# Patient Record
Sex: Female | Born: 1960 | Race: White | Hispanic: No | Marital: Married | Smoking: Never smoker
Health system: Southern US, Community
[De-identification: ages and names within clinical notes are randomized; demographics above are authoritative.]

## PROBLEM LIST (undated history)

## (undated) ENCOUNTER — Inpatient Hospital Stay (HOSPITAL_COMMUNITY): Payer: BC Managed Care – PPO

## (undated) DIAGNOSIS — R51 Headache: Secondary | ICD-10-CM

## (undated) HISTORY — PX: TONSILLECTOMY: SUR1361

## (undated) HISTORY — PX: FOOT SURGERY: SHX648

---

## 1997-09-06 ENCOUNTER — Ambulatory Visit (HOSPITAL_COMMUNITY): Admission: RE | Admit: 1997-09-06 | Discharge: 1997-09-06 | Payer: Self-pay | Admitting: Obstetrics and Gynecology

## 1997-09-17 ENCOUNTER — Other Ambulatory Visit: Admission: RE | Admit: 1997-09-17 | Discharge: 1997-09-17 | Payer: Self-pay | Admitting: Obstetrics and Gynecology

## 1998-03-14 ENCOUNTER — Ambulatory Visit (HOSPITAL_COMMUNITY): Admission: RE | Admit: 1998-03-14 | Discharge: 1998-03-14 | Payer: Self-pay | Admitting: Obstetrics and Gynecology

## 1998-09-12 ENCOUNTER — Other Ambulatory Visit: Admission: RE | Admit: 1998-09-12 | Discharge: 1998-09-12 | Payer: Self-pay | Admitting: Obstetrics and Gynecology

## 1999-05-01 ENCOUNTER — Ambulatory Visit (HOSPITAL_COMMUNITY): Admission: RE | Admit: 1999-05-01 | Discharge: 1999-05-01 | Payer: Self-pay | Admitting: Obstetrics and Gynecology

## 1999-05-01 ENCOUNTER — Encounter: Payer: Self-pay | Admitting: Obstetrics and Gynecology

## 1999-05-11 ENCOUNTER — Encounter: Payer: Self-pay | Admitting: Obstetrics and Gynecology

## 1999-05-11 ENCOUNTER — Ambulatory Visit (HOSPITAL_COMMUNITY): Admission: RE | Admit: 1999-05-11 | Discharge: 1999-05-11 | Payer: Self-pay | Admitting: Obstetrics and Gynecology

## 1999-10-12 ENCOUNTER — Other Ambulatory Visit: Admission: RE | Admit: 1999-10-12 | Discharge: 1999-10-12 | Payer: Self-pay | Admitting: Obstetrics and Gynecology

## 1999-11-01 ENCOUNTER — Encounter: Payer: Self-pay | Admitting: Obstetrics and Gynecology

## 1999-11-01 ENCOUNTER — Encounter: Admission: RE | Admit: 1999-11-01 | Discharge: 1999-11-01 | Payer: Self-pay | Admitting: Obstetrics and Gynecology

## 1999-12-31 ENCOUNTER — Encounter: Payer: Self-pay | Admitting: *Deleted

## 1999-12-31 ENCOUNTER — Emergency Department (HOSPITAL_COMMUNITY): Admission: EM | Admit: 1999-12-31 | Discharge: 1999-12-31 | Payer: Self-pay | Admitting: *Deleted

## 2000-11-05 ENCOUNTER — Encounter: Admission: RE | Admit: 2000-11-05 | Discharge: 2000-11-05 | Payer: Self-pay | Admitting: Obstetrics and Gynecology

## 2000-11-05 ENCOUNTER — Encounter: Payer: Self-pay | Admitting: Obstetrics and Gynecology

## 2001-02-12 ENCOUNTER — Encounter: Admission: RE | Admit: 2001-02-12 | Discharge: 2001-02-17 | Payer: Self-pay | Admitting: Internal Medicine

## 2001-12-23 ENCOUNTER — Encounter: Payer: Self-pay | Admitting: Obstetrics and Gynecology

## 2001-12-23 ENCOUNTER — Encounter: Admission: RE | Admit: 2001-12-23 | Discharge: 2001-12-23 | Payer: Self-pay | Admitting: Obstetrics and Gynecology

## 2003-01-07 ENCOUNTER — Encounter: Admission: RE | Admit: 2003-01-07 | Discharge: 2003-01-07 | Payer: Self-pay | Admitting: Obstetrics and Gynecology

## 2003-01-07 ENCOUNTER — Encounter: Payer: Self-pay | Admitting: Obstetrics and Gynecology

## 2003-07-22 ENCOUNTER — Other Ambulatory Visit: Admission: RE | Admit: 2003-07-22 | Discharge: 2003-07-22 | Payer: Self-pay | Admitting: Obstetrics and Gynecology

## 2004-01-20 ENCOUNTER — Encounter: Admission: RE | Admit: 2004-01-20 | Discharge: 2004-01-20 | Payer: Self-pay | Admitting: Obstetrics and Gynecology

## 2004-08-17 ENCOUNTER — Other Ambulatory Visit: Admission: RE | Admit: 2004-08-17 | Discharge: 2004-08-17 | Payer: Self-pay | Admitting: Obstetrics and Gynecology

## 2005-03-15 ENCOUNTER — Encounter: Admission: RE | Admit: 2005-03-15 | Discharge: 2005-03-15 | Payer: Self-pay | Admitting: Obstetrics and Gynecology

## 2005-10-24 ENCOUNTER — Encounter: Payer: Self-pay | Admitting: Obstetrics and Gynecology

## 2006-10-23 ENCOUNTER — Encounter: Admission: RE | Admit: 2006-10-23 | Discharge: 2006-10-23 | Payer: Self-pay | Admitting: Obstetrics and Gynecology

## 2008-01-02 ENCOUNTER — Encounter: Admission: RE | Admit: 2008-01-02 | Discharge: 2008-01-02 | Payer: Self-pay | Admitting: Obstetrics and Gynecology

## 2008-04-09 IMAGING — MG MM SCREEN MAMMOGRAM BILATERAL
5 series · 5 of 5 positions shown · non-contrast
Comparison: none

DG SCREEN MAMMOGRAM BILATERAL
Bilateral CC and MLO view(s) were taken.
Prior study comparison: January 20, 2004, bilateral screening mammogram.

DIGITAL SCREENING MAMMOGRAM WITH CAD:
There are scattered fibroglandular densities.  There is no dominant mass, architectural distortion 
or calcification to suggest malignancy.

[R CC]
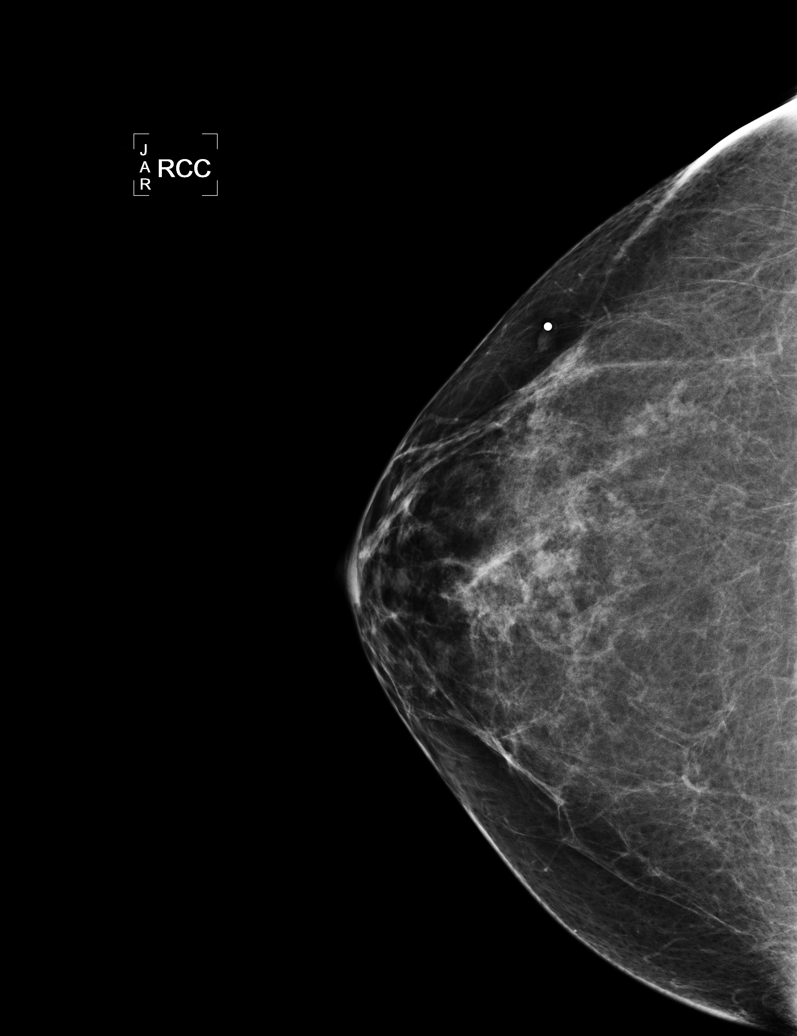

[L CC (1 of 2)]
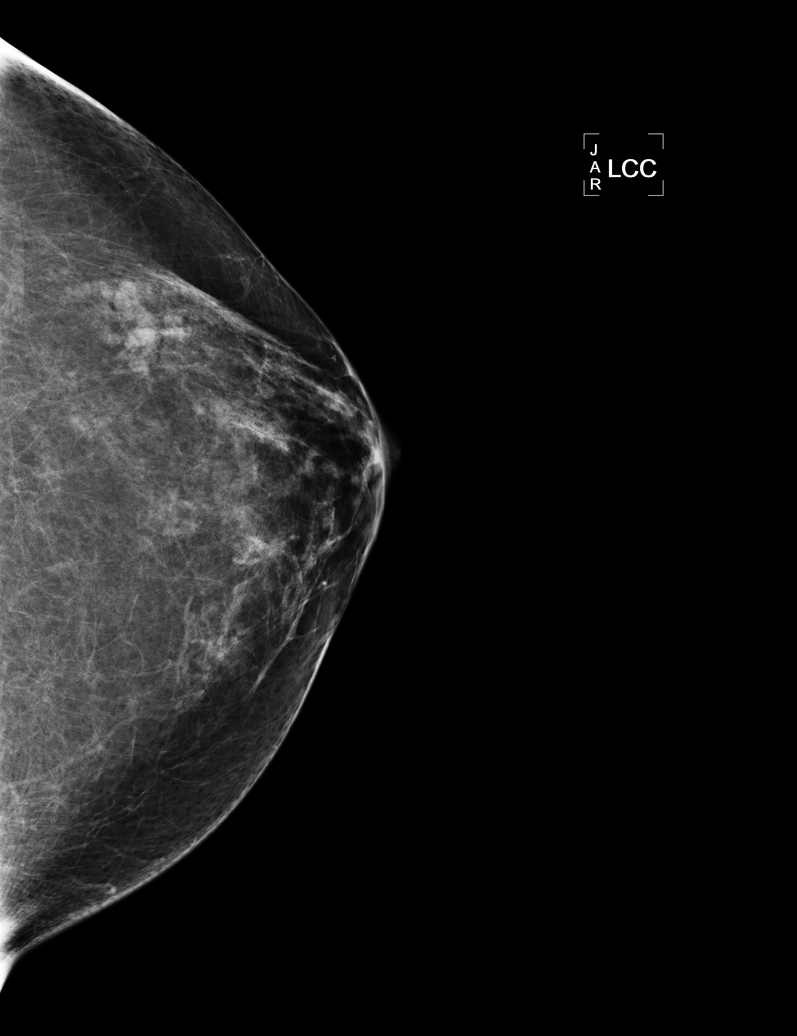

[L MLO]
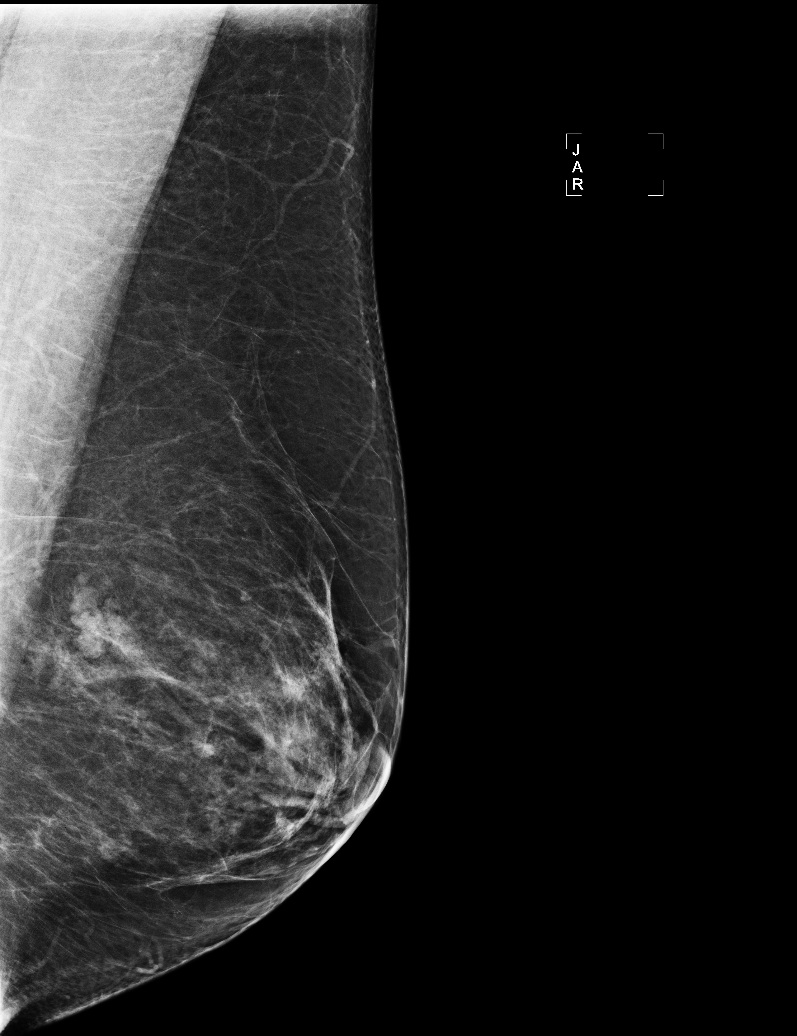

[R MLO]
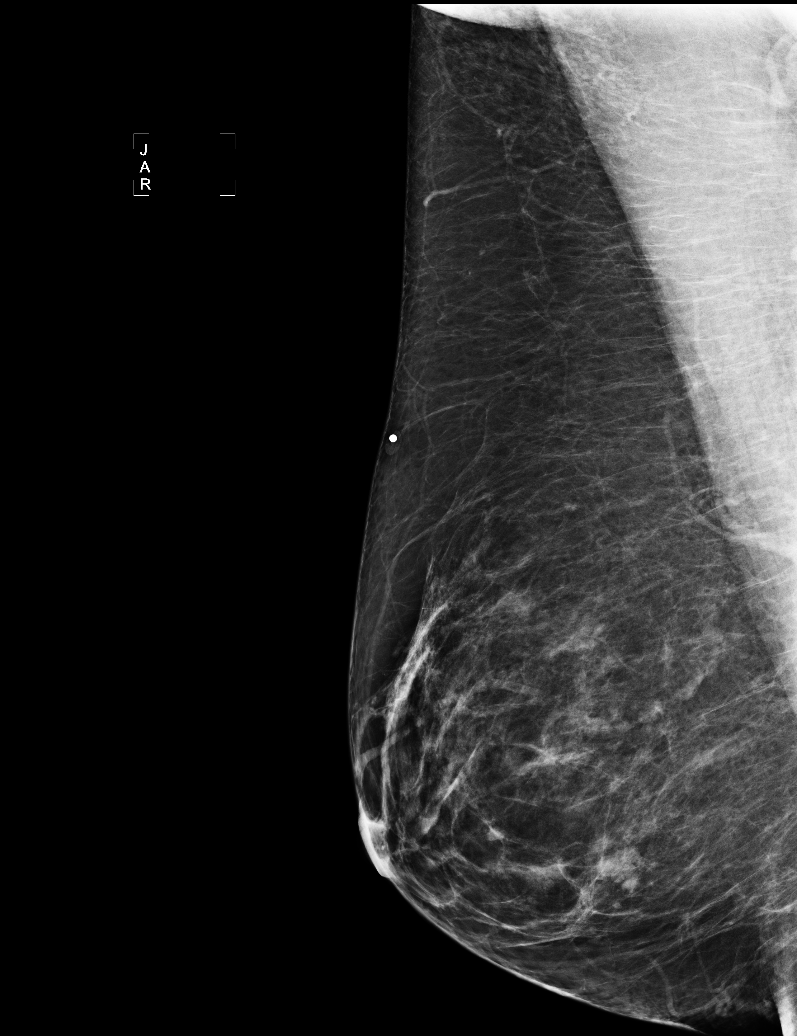

[L CC (2 of 2)]
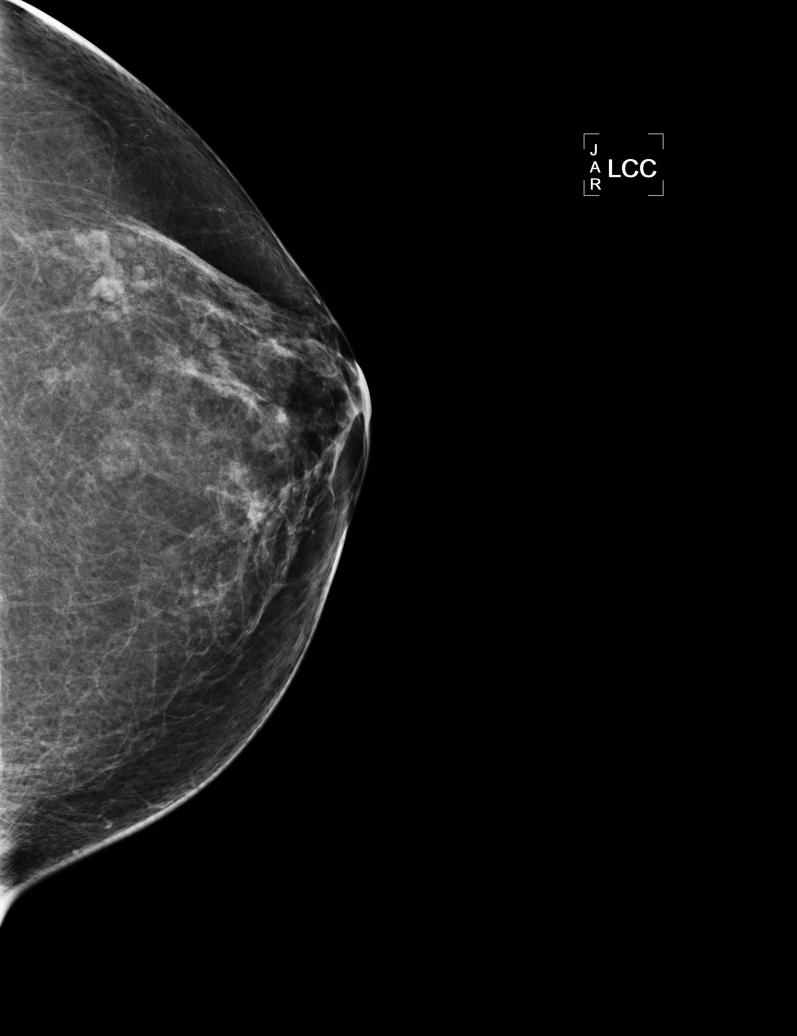

[5 of 5 positions shown; findings below may reference images not displayed]

IMPRESSION: No mammographic evidence of malignancy.  Suggest yearly screening mammography.

ASSESSMENT: Negative - BI-RADS 1

Screening mammogram in 1 year.
ANALYZED BY COMPUTER AIDED DETECTION. , THIS PROCEDURE WAS A DIGITAL MAMMOGRAM.

## 2009-01-03 ENCOUNTER — Encounter: Admission: RE | Admit: 2009-01-03 | Discharge: 2009-01-03 | Payer: Self-pay | Admitting: Obstetrics and Gynecology

## 2009-01-05 ENCOUNTER — Encounter: Admission: RE | Admit: 2009-01-05 | Discharge: 2009-01-05 | Payer: Self-pay | Admitting: Obstetrics and Gynecology

## 2010-01-10 ENCOUNTER — Encounter: Admission: RE | Admit: 2010-01-10 | Discharge: 2010-01-10 | Payer: Self-pay | Admitting: Obstetrics and Gynecology

## 2011-01-09 ENCOUNTER — Encounter (HOSPITAL_COMMUNITY)
Admission: RE | Admit: 2011-01-09 | Discharge: 2011-01-09 | Disposition: A | Discharge status: Home / Self Care | Payer: BC Managed Care – PPO | Source: Ambulatory Visit | Attending: Obstetrics and Gynecology | Admitting: Obstetrics and Gynecology

## 2011-01-09 ENCOUNTER — Encounter (HOSPITAL_COMMUNITY): Payer: Self-pay

## 2011-01-09 HISTORY — DX: Headache: R51

## 2011-01-09 LAB — CBC
HCT: 40.1 % (ref 36.0–46.0)
MCHC: 33.4 g/dL (ref 30.0–36.0)
MCV: 90.9 fL (ref 78.0–100.0)
RBC: 4.41 MIL/uL (ref 3.87–5.11)
RDW: 12.8 % (ref 11.5–15.5)

## 2011-01-09 MED ORDER — DEXTROSE 5 % IV SOLN
2.0000 g | INTRAVENOUS | Status: AC
  Administered 2011-01-10: 2 g via INTRAVENOUS
  Filled 2011-01-09 (×2): qty 2

## 2011-01-09 NOTE — Patient Instructions (Signed)
20 Tanya Lloyd  01/09/2011   Your procedure is scheduled on:  01/10/11  Report to [facility] at 1045 AM.  Call this number if you have problems the morning of surgery: [phone]   Remember:   Do not eat food:After Midnight.  Do not drink clear liquids: 4 Hours before arrival.  Take these medicines the morning of surgery with A SIP OF WATER: none   Do not wear jewelry, make-up or nail polish.  Do not bring valuables to the hospital.  Contacts, dentures or bridgework may not be worn into surgery.  Leave suitcase in the car. After surgery it may be brought to your room.  For patients admitted to the hospital, checkout time is 11:00 AM the day of discharge.   Patients discharged the day of surgery will not be allowed to drive home.  Name and phone number of your driver: Trey Paula  Special Instructions:  Please read over the following fact sheets that you were given:

## 2011-01-10 ENCOUNTER — Ambulatory Visit (HOSPITAL_COMMUNITY): Payer: BC Managed Care – PPO | Admitting: Certified Registered"

## 2011-01-10 ENCOUNTER — Encounter (HOSPITAL_COMMUNITY): Payer: Self-pay | Admitting: Certified Registered"

## 2011-01-10 ENCOUNTER — Encounter (HOSPITAL_COMMUNITY)
Admission: RE | Disposition: A | Discharge status: Home / Self Care | Payer: Self-pay | Source: Ambulatory Visit | Attending: Obstetrics and Gynecology

## 2011-01-10 ENCOUNTER — Other Ambulatory Visit: Payer: Self-pay | Admitting: Obstetrics and Gynecology

## 2011-01-10 ENCOUNTER — Inpatient Hospital Stay (HOSPITAL_COMMUNITY)
Admission: RE | Admit: 2011-01-10 | Discharge: 2011-01-12 | DRG: 359 | Disposition: A | Discharge status: Home / Self Care | Payer: BC Managed Care – PPO | Source: Ambulatory Visit | Attending: Obstetrics and Gynecology | Admitting: Obstetrics and Gynecology

## 2011-01-10 DIAGNOSIS — N393 Stress incontinence (female) (male): Secondary | ICD-10-CM | POA: Diagnosis present

## 2011-01-10 DIAGNOSIS — I341 Nonrheumatic mitral (valve) prolapse: Secondary | ICD-10-CM | POA: Insufficient documentation

## 2011-01-10 DIAGNOSIS — N814 Uterovaginal prolapse, unspecified: Secondary | ICD-10-CM

## 2011-01-10 DIAGNOSIS — Z01818 Encounter for other preprocedural examination: Secondary | ICD-10-CM

## 2011-01-10 DIAGNOSIS — N841 Polyp of cervix uteri: Secondary | ICD-10-CM | POA: Diagnosis present

## 2011-01-10 DIAGNOSIS — N8000 Endometriosis of the uterus, unspecified: Secondary | ICD-10-CM | POA: Diagnosis present

## 2011-01-10 DIAGNOSIS — Z01812 Encounter for preprocedural laboratory examination: Secondary | ICD-10-CM

## 2011-01-10 DIAGNOSIS — N812 Incomplete uterovaginal prolapse: Principal | ICD-10-CM | POA: Diagnosis present

## 2011-01-10 DIAGNOSIS — D259 Leiomyoma of uterus, unspecified: Secondary | ICD-10-CM | POA: Diagnosis present

## 2011-01-10 HISTORY — PX: BLADDER SUSPENSION: SHX72

## 2011-01-10 HISTORY — PX: ANTERIOR AND POSTERIOR REPAIR: SHX5121

## 2011-01-10 HISTORY — PX: VAGINAL HYSTERECTOMY: SHX2639

## 2011-01-10 LAB — PREGNANCY, URINE: Preg Test, Ur: NEGATIVE

## 2011-01-10 SURGERY — HYSTERECTOMY, VAGINAL
Anesthesia: General | Site: Vagina | Wound class: Clean Contaminated

## 2011-01-10 MED ORDER — PROPOFOL 10 MG/ML IV EMUL
INTRAVENOUS | Status: AC
  Filled 2011-01-10: qty 20

## 2011-01-10 MED ORDER — MIDAZOLAM HCL 5 MG/5ML IJ SOLN
INTRAMUSCULAR | Status: DC | PRN
  Administered 2011-01-10: 2 mg via INTRAVENOUS

## 2011-01-10 MED ORDER — INDIGOTINDISULFONATE SODIUM 8 MG/ML IJ SOLN
INTRAMUSCULAR | Status: DC | PRN
  Administered 2011-01-10: 5 mL via INTRAVENOUS

## 2011-01-10 MED ORDER — LIDOCAINE HCL (CARDIAC) 20 MG/ML IV SOLN
INTRAVENOUS | Status: AC
  Filled 2011-01-10: qty 5

## 2011-01-10 MED ORDER — HYDROMORPHONE 0.3 MG/ML IV SOLN
INTRAVENOUS | Status: AC
  Filled 2011-01-10: qty 25

## 2011-01-10 MED ORDER — HYDROMORPHONE 0.3 MG/ML IV SOLN
INTRAVENOUS | Status: DC
  Administered 2011-01-10: 19:00:00 via INTRAVENOUS
  Administered 2011-01-10: 0.9 mg via INTRAVENOUS
  Administered 2011-01-11 (×2): 0.3 mg via INTRAVENOUS

## 2011-01-10 MED ORDER — EPHEDRINE 5 MG/ML INJ
INTRAVENOUS | Status: AC
  Filled 2011-01-10: qty 10

## 2011-01-10 MED ORDER — KETOROLAC TROMETHAMINE 30 MG/ML IJ SOLN
30.0000 mg | Freq: Four times a day (QID) | INTRAMUSCULAR | Status: AC
  Administered 2011-01-10 – 2011-01-11 (×2): 30 mg via INTRAVENOUS
  Filled 2011-01-10 (×2): qty 1

## 2011-01-10 MED ORDER — ALUM & MAG HYDROXIDE-SIMETH 200-200-20 MG/5ML PO SUSP: 30.0000 mL | ORAL | Status: DC | PRN

## 2011-01-10 MED ORDER — MORPHINE SULFATE 10 MG/ML IJ SOLN
INTRAMUSCULAR | Status: AC
  Filled 2011-01-10: qty 1

## 2011-01-10 MED ORDER — MORPHINE SULFATE 10 MG/ML IJ SOLN
INTRAMUSCULAR | Status: DC | PRN
  Administered 2011-01-10 (×5): 2 mg via INTRAVENOUS

## 2011-01-10 MED ORDER — DEXAMETHASONE SODIUM PHOSPHATE 10 MG/ML IJ SOLN
INTRAMUSCULAR | Status: DC | PRN
  Administered 2011-01-10: 4 mg via INTRAVENOUS

## 2011-01-10 MED ORDER — ONDANSETRON HCL 4 MG PO TABS
4.0000 mg | ORAL_TABLET | Freq: Four times a day (QID) | ORAL | Status: DC | PRN
  Administered 2011-01-11 – 2011-01-12 (×2): 4 mg via ORAL
  Filled 2011-01-10 (×2): qty 1

## 2011-01-10 MED ORDER — DIPHENHYDRAMINE HCL 12.5 MG/5ML PO ELIX: 12.5000 mg | ORAL_SOLUTION | Freq: Four times a day (QID) | ORAL | Status: DC | PRN

## 2011-01-10 MED ORDER — LIDOCAINE HCL (PF) 2 % IJ SOLN
INTRAMUSCULAR | Status: DC | PRN
  Administered 2011-01-10: 100 mg

## 2011-01-10 MED ORDER — FENTANYL CITRATE 0.05 MG/ML IJ SOLN
25.0000 ug | INTRAMUSCULAR | Status: DC | PRN
  Administered 2011-01-10: 25 ug via INTRAVENOUS
  Administered 2011-01-10: 50 ug via INTRAVENOUS
  Administered 2011-01-10: 25 ug via INTRAVENOUS

## 2011-01-10 MED ORDER — ONDANSETRON HCL 4 MG/2ML IJ SOLN
4.0000 mg | Freq: Four times a day (QID) | INTRAMUSCULAR | Status: DC | PRN
  Administered 2011-01-10: 4 mg via INTRAVENOUS

## 2011-01-10 MED ORDER — PROPOFOL 10 MG/ML IV EMUL
INTRAVENOUS | Status: DC | PRN
  Administered 2011-01-10 (×2): 10 mg via INTRAVENOUS
  Administered 2011-01-10: 200 mg via INTRAVENOUS

## 2011-01-10 MED ORDER — ROCURONIUM BROMIDE 50 MG/5ML IV SOLN
INTRAVENOUS | Status: AC
  Filled 2011-01-10: qty 1

## 2011-01-10 MED ORDER — KETOROLAC TROMETHAMINE 30 MG/ML IJ SOLN
INTRAMUSCULAR | Status: DC | PRN
  Administered 2011-01-10: 30 mg via INTRAVENOUS

## 2011-01-10 MED ORDER — SUMATRIPTAN SUCCINATE 50 MG PO TABS
50.0000 mg | ORAL_TABLET | ORAL | Status: DC | PRN
  Filled 2011-01-10: qty 1

## 2011-01-10 MED ORDER — STERILE WATER FOR IRRIGATION IR SOLN
Status: DC | PRN
  Administered 2011-01-10: 1000 mL

## 2011-01-10 MED ORDER — ONDANSETRON HCL 4 MG/2ML IJ SOLN
INTRAMUSCULAR | Status: AC
  Filled 2011-01-10: qty 2

## 2011-01-10 MED ORDER — TEMAZEPAM 15 MG PO CAPS: 15.0000 mg | ORAL_CAPSULE | Freq: Every evening | ORAL | Status: DC | PRN

## 2011-01-10 MED ORDER — ONDANSETRON HCL 4 MG/2ML IJ SOLN
INTRAMUSCULAR | Status: DC | PRN
  Administered 2011-01-10: 4 mg via INTRAVENOUS

## 2011-01-10 MED ORDER — NEOSTIGMINE METHYLSULFATE 1 MG/ML IJ SOLN
INTRAMUSCULAR | Status: AC
  Filled 2011-01-10: qty 10

## 2011-01-10 MED ORDER — FENTANYL CITRATE 0.05 MG/ML IJ SOLN
INTRAMUSCULAR | Status: DC | PRN
  Administered 2011-01-10: 50 ug via INTRAVENOUS
  Administered 2011-01-10: 25 ug via INTRAVENOUS
  Administered 2011-01-10: 100 ug via INTRAVENOUS
  Administered 2011-01-10: 25 ug via INTRAVENOUS
  Administered 2011-01-10: 50 ug via INTRAVENOUS

## 2011-01-10 MED ORDER — SIMETHICONE 80 MG PO CHEW: 80.0000 mg | CHEWABLE_TABLET | Freq: Four times a day (QID) | ORAL | Status: DC | PRN

## 2011-01-10 MED ORDER — GLYCOPYRROLATE 0.2 MG/ML IJ SOLN
INTRAMUSCULAR | Status: AC
  Filled 2011-01-10: qty 1

## 2011-01-10 MED ORDER — MEPERIDINE HCL 25 MG/ML IJ SOLN: 6.2500 mg | INTRAMUSCULAR | Status: DC | PRN

## 2011-01-10 MED ORDER — SODIUM CHLORIDE 0.9 % IJ SOLN: 9.0000 mL | INTRAMUSCULAR | Status: DC | PRN

## 2011-01-10 MED ORDER — OXYCODONE-ACETAMINOPHEN 5-325 MG PO TABS
1.0000 | ORAL_TABLET | ORAL | Status: DC | PRN
  Administered 2011-01-11: 1 via ORAL
  Filled 2011-01-10: qty 2

## 2011-01-10 MED ORDER — DOCUSATE SODIUM 100 MG PO CAPS
100.0000 mg | ORAL_CAPSULE | Freq: Every day | ORAL | Status: DC
  Administered 2011-01-11 – 2011-01-12 (×2): 100 mg via ORAL
  Filled 2011-01-10 (×2): qty 1

## 2011-01-10 MED ORDER — LIDOCAINE-EPINEPHRINE (PF) 1 %-1:200000 IJ SOLN
INTRAMUSCULAR | Status: DC | PRN
  Administered 2011-01-10: 55 mL

## 2011-01-10 MED ORDER — LACTATED RINGERS IV SOLN
INTRAVENOUS | Status: DC
  Administered 2011-01-10 – 2011-01-11 (×2): via INTRAVENOUS

## 2011-01-10 MED ORDER — IBUPROFEN 600 MG PO TABS
600.0000 mg | ORAL_TABLET | Freq: Four times a day (QID) | ORAL | Status: DC | PRN
  Administered 2011-01-11 – 2011-01-12 (×3): 600 mg via ORAL
  Filled 2011-01-10 (×3): qty 1

## 2011-01-10 MED ORDER — MIDAZOLAM HCL 2 MG/2ML IJ SOLN
INTRAMUSCULAR | Status: AC
  Filled 2011-01-10: qty 2

## 2011-01-10 MED ORDER — KETOROLAC TROMETHAMINE 30 MG/ML IJ SOLN: 30.0000 mg | Freq: Four times a day (QID) | INTRAMUSCULAR | Status: DC

## 2011-01-10 MED ORDER — FENTANYL CITRATE 0.05 MG/ML IJ SOLN
INTRAMUSCULAR | Status: AC
  Filled 2011-01-10: qty 5

## 2011-01-10 MED ORDER — ONDANSETRON HCL 4 MG/2ML IJ SOLN
4.0000 mg | Freq: Four times a day (QID) | INTRAMUSCULAR | Status: DC | PRN
  Administered 2011-01-11: 4 mg via INTRAVENOUS
  Filled 2011-01-10 (×2): qty 2

## 2011-01-10 MED ORDER — EPHEDRINE SULFATE 50 MG/ML IJ SOLN
INTRAMUSCULAR | Status: DC | PRN
  Administered 2011-01-10: 10 mg via INTRAVENOUS

## 2011-01-10 MED ORDER — FENTANYL CITRATE 0.05 MG/ML IJ SOLN
INTRAMUSCULAR | Status: AC
  Administered 2011-01-10: 25 ug via INTRAVENOUS
  Filled 2011-01-10: qty 2

## 2011-01-10 MED ORDER — DULOXETINE HCL 30 MG PO CPEP
30.0000 mg | ORAL_CAPSULE | Freq: Every day | ORAL | Status: DC
  Administered 2011-01-11 – 2011-01-12 (×2): 30 mg via ORAL
  Filled 2011-01-10 (×4): qty 1

## 2011-01-10 MED ORDER — GLYCOPYRROLATE 0.2 MG/ML IJ SOLN
INTRAMUSCULAR | Status: DC | PRN
  Administered 2011-01-10: .15 mg via INTRAVENOUS

## 2011-01-10 MED ORDER — DIPHENHYDRAMINE HCL 50 MG/ML IJ SOLN: 12.5000 mg | Freq: Four times a day (QID) | INTRAMUSCULAR | Status: DC | PRN

## 2011-01-10 MED ORDER — MENTHOL 3 MG MT LOZG: 1.0000 | LOZENGE | OROMUCOSAL | Status: DC | PRN

## 2011-01-10 MED ORDER — ESTRADIOL 0.1 MG/GM VA CREA
TOPICAL_CREAM | VAGINAL | Status: DC | PRN
  Administered 2011-01-10: 1 via VAGINAL

## 2011-01-10 MED ORDER — NALOXONE HCL 0.4 MG/ML IJ SOLN: 0.4000 mg | INTRAMUSCULAR | Status: DC | PRN

## 2011-01-10 MED ORDER — LACTATED RINGERS IV SOLN
INTRAVENOUS | Status: DC
  Administered 2011-01-10 (×2): via INTRAVENOUS

## 2011-01-10 MED ORDER — BISACODYL 10 MG RE SUPP: 10.0000 mg | Freq: Every day | RECTAL | Status: DC | PRN

## 2011-01-10 MED ORDER — SENNOSIDES-DOCUSATE SODIUM 8.6-50 MG PO TABS: 2.0000 | ORAL_TABLET | Freq: Every day | ORAL | Status: DC | PRN

## 2011-01-10 MED ORDER — DEXAMETHASONE SODIUM PHOSPHATE 10 MG/ML IJ SOLN
INTRAMUSCULAR | Status: AC
  Filled 2011-01-10: qty 1

## 2011-01-10 SURGICAL SUPPLY — 59 items
BAG URINE DRAINAGE (UROLOGICAL SUPPLIES) ×1 IMPLANT
BLADE SURG 11 STRL SS (BLADE) ×2 IMPLANT
BLADE SURG 15 STRL LF C SS BP (BLADE) ×1 IMPLANT
BLADE SURG 15 STRL SS (BLADE) ×2
CANISTER SUCTION 2500CC (MISCELLANEOUS) ×3 IMPLANT
CATH FOLEY 2WAY SLVR  5CC 18FR (CATHETERS) ×1
CATH FOLEY 2WAY SLVR 5CC 18FR (CATHETERS) ×2 IMPLANT
CLOTH BEACON ORANGE TIMEOUT ST (SAFETY) ×3 IMPLANT
CONT PATH 16OZ SNAP LID 3702 (MISCELLANEOUS) IMPLANT
DECANTER SPIKE VIAL GLASS SM (MISCELLANEOUS) IMPLANT
DERMABOND ADVANCED (GAUZE/BANDAGES/DRESSINGS) ×3 IMPLANT
DRAPE CAMERA CLOSED 9X96 (DRAPES) IMPLANT
DRAPE HYSTEROSCOPY (DRAPE) ×2 IMPLANT
DRAPE STERI URO 9X17 APER PCH (DRAPES) ×2 IMPLANT
DRAPE UTILITY XL STRL (DRAPES) ×2 IMPLANT
DRSG COVADERM PLUS 2X2 (GAUZE/BANDAGES/DRESSINGS) IMPLANT
GAUZE PACKING 2X5 YD STERILE (GAUZE/BANDAGES/DRESSINGS) IMPLANT
GAUZE SPONGE 4X4 16PLY XRAY LF (GAUZE/BANDAGES/DRESSINGS) ×3 IMPLANT
GLOVE BIO SURGEON STRL SZ7.5 (GLOVE) ×4 IMPLANT
GLOVE BIOGEL PI IND STRL 7.0 (GLOVE) ×2 IMPLANT
GLOVE BIOGEL PI IND STRL 7.5 (GLOVE) ×1 IMPLANT
GLOVE BIOGEL PI INDICATOR 7.0 (GLOVE) ×2
GLOVE BIOGEL PI INDICATOR 7.5 (GLOVE) ×1
GLOVE ECLIPSE 6.0 STRL STRAW (GLOVE) ×1 IMPLANT
GLOVE ECLIPSE 6.5 STRL STRAW (GLOVE) ×2 IMPLANT
GLOVE INDICATOR 6.5 STRL GRN (GLOVE) ×1 IMPLANT
GOWN BRE IMP SLV AUR LG STRL (GOWN DISPOSABLE) ×11 IMPLANT
NDL SPNL 20GX3.5 QUINCKE YW (NEEDLE) ×1 IMPLANT
NDL SPNL 22GX3.5 QUINCKE BK (NEEDLE) IMPLANT
NEEDLE HYPO 22GX1.5 SAFETY (NEEDLE) ×2 IMPLANT
NEEDLE SPNL 20GX3.5 QUINCKE YW (NEEDLE) ×2 IMPLANT
NEEDLE SPNL 22GX3.5 QUINCKE BK (NEEDLE) IMPLANT
NS IRRIG 1000ML POUR BTL (IV SOLUTION) ×3 IMPLANT
PACK VAGINAL WOMENS (CUSTOM PROCEDURE TRAY) ×3 IMPLANT
SET CYSTO W/LG BORE CLAMP LF (SET/KITS/TRAYS/PACK) ×2 IMPLANT
SLING TRANS VAGINAL TAPE (Sling) ×1 IMPLANT
SLING UTERINE/ABD GYNECARE TVT (Sling) ×1 IMPLANT
SUT MNCRL AB 3-0 PS2 27 (SUTURE) IMPLANT
SUT MNCRL AB 4-0 PS2 18 (SUTURE) IMPLANT
SUT NOVA NAB GS-22 2 0 T19 (SUTURE) IMPLANT
SUT SILK 2 0 FSL 18 (SUTURE) IMPLANT
SUT VIC AB 0 CT1 18XCR BRD8 (SUTURE) ×3 IMPLANT
SUT VIC AB 0 CT1 27 (SUTURE) ×4
SUT VIC AB 0 CT1 27XBRD ANBCTR (SUTURE) ×3 IMPLANT
SUT VIC AB 0 CT1 8-18 (SUTURE) ×4
SUT VIC AB 2-0 CT1 27 (SUTURE)
SUT VIC AB 2-0 CT1 TAPERPNT 27 (SUTURE) IMPLANT
SUT VIC AB 2-0 CT2 27 (SUTURE) IMPLANT
SUT VIC AB 2-0 SH 27 (SUTURE) ×20
SUT VIC AB 2-0 SH 27XBRD (SUTURE) ×8 IMPLANT
SUT VIC AB 3-0 CT1 27 (SUTURE)
SUT VIC AB 3-0 CT1 TAPERPNT 27 (SUTURE) IMPLANT
SUT VIC AB 3-0 SH 27 (SUTURE)
SUT VIC AB 3-0 SH 27XBRD (SUTURE) IMPLANT
SUT VICRYL 0 TIES 12 18 (SUTURE) ×2 IMPLANT
SYR TB 1ML 25GX5/8 (SYRINGE) ×2 IMPLANT
TOWEL OR 17X24 6PK STRL BLUE (TOWEL DISPOSABLE) ×7 IMPLANT
TRAY FOLEY CATH 14FR (SET/KITS/TRAYS/PACK) ×3 IMPLANT
WATER STERILE IRR 1000ML POUR (IV SOLUTION) ×4 IMPLANT

## 2011-01-10 NOTE — Transfer of Care (Incomplete)
Immediate Anesthesia Transfer of Care Note  Patient: Tanya Lloyd  Procedure(s) Performed:  HYSTERECTOMY VAGINAL; ANTERIOR (CYSTOCELE) AND POSTERIOR REPAIR (RECTOCELE); TRANSVAGINAL TAPE (TVT) PROCEDURE - with Cystoscopy  Patient Location: {PLACES; ANE POST:19477::"PACU"}  Anesthesia Type: {PROCEDURES; ANE POST ANESTHESIA TYPE:19480}  Level of Consciousness: {FINDINGS; ANE POST LEVEL OF CONSCIOUSNESS:19484}  Airway & Oxygen Therapy: {Exam; oxygen device:30095}  Post-op Assessment: {ASSESSMENT;POST-OP ZOXWRU:04540}  Post vital signs: {DESC; ANE POST JWJXBJ:47829}  Complications: {FINDINGS; ANE POST COMPLICATIONS:19485}

## 2011-01-10 NOTE — Transfer of Care (Signed)
Immediate Anesthesia Transfer of Care Note  Patient: Tanya Lloyd  Procedure(s) Performed:  HYSTERECTOMY VAGINAL; ANTERIOR (CYSTOCELE) AND POSTERIOR REPAIR (RECTOCELE); TRANSVAGINAL TAPE (TVT) PROCEDURE - with Cystoscopy  Patient Location: PACU  Anesthesia Type: General  Level of Consciousness: awake, alert  and oriented  Airway & Oxygen Therapy: Patient Spontanous Breathing and Patient connected to nasal cannula oxygen  Post-op Assessment: Report given to PACU RN and Post -op Vital signs reviewed and stable  Post vital signs: Reviewed and stable  Complications: No apparent anesthesia complications

## 2011-01-10 NOTE — Op Note (Addendum)
Preop Diagnosis: SUI  Postop Diagnosis: SUI  Procedure:1.TVT 2. Cystoscopy  Complications:none  Procedure: While the patient was in day surgery prior to starting the procedure, the risk benefits and alternatives were discussed with the patient including but not limited to bleeding, infection, injury, urinary retention and erosion of mesh. The patient was taken to the operating room where Dr. Estanislado Pandy performed her portion of the surgery. I then proceeded to perform a TVT and cystoscopy. The anterior vaginal wall was already incised and this area was incised further rostrally. The underlying tissue was dissected away down to the level of the lower symphysis pubis bilaterally. Attention was then turned to the mons pubis where two 5 mm incisions were made 2 fingerbreadths from the midline. The transabdominal guide was then passed through the mons pubis incision on the patient's right down through the space of Retzius and out through the anterior vaginal wall after deflecting the rigid urethral catheter guide to the ipsilateral side. The same was done on the contralateral side. Cystoscopy was performed and no invadvertant bladder injury was noted. The bladder was drained with a Foley while deflecting the rigid urethral catheter guide to the patient's right and the mesh was attached to the transabdominal guide and elevated up through the space of Retzius and out through the incision on the mons pubis on the ipsilateral side. The same was done on the contralateral side. Cystoscopy was performed again and no inadvertant bladder injury was noted. The 64 French Foley was left in the urethra and a large Tresa Endo was placed between the urethra and the mesh in order to leave the mesh slack beneath the midurethra. The mesh was then cut flush with the skin at the mons pubis incisions bilaterally. Indigo carmine had been administered, cystoscopy was performed again and bilateral ureters were noted to efflux without difficulty.  The bilateral incisions on the mons pubis were then cleaned and Dermabond applied. The anterior vaginal wall incision was repaired with 2-0 vicryl with interrupted stitches.  Vagina was packed with estrogen soaked vaginal packing.  Sponge, lap and needle count was correct.  The patient tolerated the procedure well and was returned to the recovery room in good condition.

## 2011-01-10 NOTE — Anesthesia Procedure Notes (Addendum)
Procedures

## 2011-01-10 NOTE — Anesthesia Postprocedure Evaluation (Signed)
  Anesthesia Post-op Note  Patient: Tanya Lloyd  Procedure(s) Performed:  HYSTERECTOMY VAGINAL; ANTERIOR (CYSTOCELE) AND POSTERIOR REPAIR (RECTOCELE); TRANSVAGINAL TAPE (TVT) PROCEDURE - with Cystoscopy  Patient Location: PACU  Anesthesia Type: General  Level of Consciousness: awake, alert  and oriented  Airway and Oxygen Therapy: Patient Spontanous Breathing  Post-op Pain: 3 /10  Post-op Assessment: Post-op Vital signs reviewed, Patient's Cardiovascular Status Stable, Respiratory Function Stable, Patent Airway, No signs of Nausea or vomiting and Pain level controlled  Post-op Vital Signs: Reviewed and stable  Complications: No apparent anesthesia complications

## 2011-01-10 NOTE — OR Nursing (Signed)
Narda Rutherford, RN also participated in the 1245 Time Out.

## 2011-01-10 NOTE — Anesthesia Preprocedure Evaluation (Addendum)
Anesthesia Evaluation  Name, MR# and DOB Patient awake  General Assessment Comment  Reviewed: Allergy & Precautions, H&P , Patient's Chart, lab work & pertinent test results and reviewed documented beta blocker date and time   Airway Mallampati: II TM Distance: <3 FB Neck ROM: full   Comment: Slightly small chin No anesthesia problems in past Dental No notable dental hx    Pulmonaryneg pulmonary ROS    clear to auscultation  pulmonary exam normal   Cardiovascular regular Normal   Neuro/PsychNegative Neurological ROS Negative Psych ROS  GI/Hepatic/Renal negative GI ROS, negative Liver ROS, and negative Renal ROS (+)       Endo/Other  Negative Endocrine ROS (+)  Morbid obesity Abdominal   Musculoskeletal  Hematology negative hematology ROS (+)   Peds  Reproductive/Obstetrics   Anesthesia Other Findings             Anesthesia Physical Anesthesia Plan  ASA: III  Anesthesia Plan: General   Post-op Pain Management:    Induction: Intravenous  Airway Management Planned: LMA  Additional Equipment:   Intra-op Plan:   Post-operative Plan:   Informed Consent: I have reviewed the patients History and Physical, chart, labs and discussed the procedure including the risks, benefits and alternatives for the proposed anesthesia with the patient or authorized representative who has indicated his/her understanding and acceptance.   Dental Advisory Given  Plan Discussed with: CRNA and Surgeon  Anesthesia Plan Comments: ( 13/40 Discussed  general anesthesia, including possible nausea, instrumentation of airway, sore throat,pulmonary aspiration, etc. I asked if the were any outstanding questions, or  concerns before we proceeded. )      Anesthesia Quick Evaluation

## 2011-01-10 NOTE — H&P (Signed)
History and Physical Interval Note:   01/10/2011   12:42 PM   Tanya Lloyd  has presented today for surgery, with the diagnosis of pelvic prolapse;stress urinary incontinence  The various methods of treatment have been discussed with the patient and family. After consideration of risks, benefits and other options for treatment, the patient has consented to  Procedure(s): HYSTERECTOMY VAGINAL ANTERIOR (CYSTOCELE) AND POSTERIOR REPAIR (RECTOCELE) TRANSVAGINAL TAPE (TVT) PROCEDURE as a surgical intervention .  I have reviewed the patients' chart and labs.  Questions were answered to the patient's satisfaction.    This is an update to [facility] dictation [phone].  Silverio Lay A  MD

## 2011-01-10 NOTE — Brief Op Note (Signed)
  01/10/2011  3:17 PM  PATIENT:  Mena Pauls  50 y.o. female  PRE-OPERATIVE DIAGNOSIS:  pelvic prolapse;stress urinary incontinence, cystocele and rectocele  POST-OPERATIVE DIAGNOSIS:  same  PROCEDURE:  Procedure(s): HYSTERECTOMY VAGINAL ANTERIOR (CYSTOCELE) AND POSTERIOR REPAIR (RECTOCELE) TRANSVAGINAL TAPE (TVT) PROCEDURE  SURGEON:  Surgeon(s): Dois Davenport A Orest Dygert Purcell Nails MD  ASSISTANTS: Osborn Coho MD  ANESTHESIA:   local and general  ESTIMATED BLOOD LOSS: * No blood loss amount entered *   LOCAL MEDICATIONS USED:  OTHER lidocaine 1% with epinephrine 1: 200,000  SPECIMEN:  Uterus  DISPOSITION OF SPECIMEN:  PATHOLOGY  COUNTS:  YES  EBL: 200 mL  DICTATION #: [id number]  PATIENT DISPOSITION:  PACU - hemodynamically stable.    NFAOZH,YQMVHQ A MD 01/10/2011 3:17 PM

## 2011-01-11 LAB — CBC
Hemoglobin: 10.8 g/dL — ABNORMAL LOW (ref 12.0–15.0)
MCH: 30.6 pg (ref 26.0–34.0)
RBC: 3.53 MIL/uL — ABNORMAL LOW (ref 3.87–5.11)
WBC: 14 10*3/uL — ABNORMAL HIGH (ref 4.0–10.5)

## 2011-01-11 MED ORDER — PROMETHAZINE HCL 25 MG/ML IJ SOLN
12.5000 mg | INTRAVENOUS | Status: DC
  Filled 2011-01-11: qty 0.5

## 2011-01-11 MED ORDER — SODIUM CHLORIDE 0.9 % IJ SOLN: 3.0000 mL | Freq: Two times a day (BID) | INTRAMUSCULAR | Status: DC

## 2011-01-11 MED ORDER — SODIUM CHLORIDE 0.9 % IJ SOLN
3.0000 mL | Freq: Once | INTRAMUSCULAR | Status: AC
  Administered 2011-01-11: 3 mL via INTRAVENOUS

## 2011-01-11 MED ORDER — PROMETHAZINE HCL 25 MG/ML IJ SOLN
12.5000 mg | Freq: Four times a day (QID) | INTRAMUSCULAR | Status: DC | PRN
  Administered 2011-01-11: 12.5 mg via INTRAVENOUS
  Filled 2011-01-11: qty 1

## 2011-01-11 MED ORDER — TRAMADOL HCL 50 MG PO TABS
50.0000 mg | ORAL_TABLET | Freq: Four times a day (QID) | ORAL | Status: DC | PRN
  Administered 2011-01-11 – 2011-01-12 (×4): 50 mg via ORAL
  Filled 2011-01-11 (×5): qty 1

## 2011-01-11 NOTE — Progress Notes (Addendum)
  1 Day Post-Op Procedure(s) (LRB): HYSTERECTOMY VAGINAL (N/A) ANTERIOR (CYSTOCELE) AND POSTERIOR REPAIR (RECTOCELE) (N/A) TRANSVAGINAL TAPE (TVT) PROCEDURE (N/A)  Subjective: Patient reports that pain is well managed with iv Toradol only: 3/10 Tolerating clear liquids  diet without difficulty. Struggled with nausea every time used Dilaudid PCA / no vomiting this am.  Ambulating. Foley catheter draining well...has not been removed yet.  Objective: BP 102/69  Pulse 68  Temp(Src) 97.6 F (36.4 C) (Oral)  Resp 16  Ht 5\' 9"  (1.753 m)  SpO2 98% Lungs: clear Heart: normal rate and rhythm Abdomen:soft and appropriately tender Extremities: Homans sign is negative, no sign of DVT Incision: supra-pubic with slight tenderness.  Assessment: s/p Procedure(s): HYSTERECTOMY VAGINAL ANTERIOR (CYSTOCELE) AND POSTERIOR REPAIR (RECTOCELE) TRANSVAGINAL TAPE (TVT) PROCEDURE: progressing well  Plan: Advance diet Encourage ambulation Advance to PO medication Observe voiding success after Foley removal  LOS: 1 day    Shivansh Hardaway A 01/11/2011, 8:23 AM     Results for MACLAINE, AHOLA (MRN [id number]) as of 01/11/2011 08:29  Ref. Range 01/09/2011 16:25 01/11/2011 05:15  WBC Latest Range: 4.0-10.5 K/uL 8.3 14.0 (H)  RBC Latest Range: 3.87-5.11 MIL/uL 4.41 3.53 (L)  HGB Latest Range: 12.0-15.0 g/dL 40.9 81.1 (L)  HCT Latest Range: 36.0-46.0 % 40.1 32.3 (L)  MCV Latest Range: 78.0-100.0 fL 90.9 91.5  MCH Latest Range: 26.0-34.0 pg 30.4 30.6  MCHC Latest Range: 30.0-36.0 g/dL 91.4 78.2  RDW Latest Range: 11.5-15.5 % 12.8 12.8  Platelets Latest Range: 150-400 K/uL 294 243

## 2011-01-11 NOTE — Op Note (Signed)
NAMEELLAN, Tanya              ACCOUNT NO.:  [id number]  MEDICAL RECORD NO.:  [id number]  LOCATION:  9318                          FACILITY:  [facility]  PHYSICIAN:  Crist Fat. Jacen Carlini, M.D. DATE OF BIRTH:  [birth year]  DATE OF PROCEDURE:  01/10/2011 DATE OF DISCHARGE:                              OPERATIVE REPORT   PREOPERATIVE DIAGNOSES:  Uterine prolapse with cystocele and rectocele.  POSTOPERATIVE DIAGNOSIS:  Uterine prolapse with cystocele and rectocele.  ANESTHESIA:  General.  PROCEDURE:  Total vaginal hysterectomy with anterior and posterior repair.  SURGEON:  Crist Fat. Lane Kjos, MD  ASSISTANT:  Osborn Coho, MD  ESTIMATED BLOOD LOSS:  200 mL.  PROCEDURE:  After being informed of the planned procedure with possible complications including bleeding, infection, injury to other organs such as bladder, bowels, ureters, possibility of recurrence of prolapse, postop voiding difficulties, expected hospital stay and expected recovery, informed consent was obtained.  The patient was taken to OR #8 given general anesthesia with endotracheal intubation without any complication.  She was placed in the lithotomy position, prepped and draped in a sterile fashion and a Foley catheter was inserted in her bladder.  Pelvic exam reveals an anteverted uterus normal in size and shape with a prolapse of 3-4/4 as well as a cystocele grade III to IV and a rectocele grade II to III.  Adnexa are felt and normal.  A weighted speculum was inserted in the vagina and the anterior lip of the cervix was grasped with a Jacobs forceps.  We proceed with infiltration of the vaginal mucosa around the cervix using lidocaine 1% with epinephrine 1:200,000.  We then performed a 360 degrees incision on the mucosa and then bluntly dissect the anterior vaginal mucosa and the posterior vaginal mucosa.  This gives [place] easy access to the posterior cul-de-sac which was entered sharply.  Pulling the bladder up  with retractors, we are able not to isolate the uterosacral ligaments with Rogers forceps, sectioned them and sutured them with a transfix suture of 0 Vicryl.  We can now isolate the vascular pedicle on both sides using Rogers forceps.  Those pedicles are sectioned and sutured with 0 Vicryl.  The anterior cul-de-sac is opened.  Another pedicle of the remaining vessels was then isolated on each side with Rogers forceps, sectioned and sutured with 0 Vicryl.  The fundus was then delivered via the posterior cul-de-sac and we are able to now isolate the final pedicle to release the uterus entirely which contains broad ligament, utero-ovarian ligament and round ligament.  The uterus was then removed entirely and each pedicle was doubly sutured with a transfix 0 Vicryl suture.  Hemostasis was then completed on both uterosacral ligaments with a figure-of-eight stitch of 0 Vicryl.  Both ovaries are evaluated and normal except for a 1-cm simple cyst on the left ovary which was punctured and drained.  We then placed our suspension sutures on both sides of the posterior vagina using a 0 Vicryl, reuniting posterior vaginal mucosa, posterior peritoneum, uterosacral ligament, anterior peritoneum, posterior peritoneum and posterior vaginal mucosa.  The uterosacral ligaments were then sutured together and the suspension sutures were closed.  We then proceed with the anterior repair isolating  the anterior vaginal mucosa.  We infiltrated medially using lidocaine 1%, epinephrine 1:200,000 all the way to 1-cm below the urethral opening.  We then undermined that vaginal mucosa and incise it medially all the way to 1- cm below the ureteral opening.  We then sharply and bluntly dissect the prevesical fascia from the anterior vaginal mucosa until we are able to mobilize the cystocele completely.  Cystocele was then corrected and plicated with multiple layers of U stitches of 2-0 Vicryl.  Excess vaginal mucosa  was then removed and the anterior vaginal mucosa was closed with figure-of-eight stitches of 2-0 Vicryl.  The vaginal vault was also closed transversally using figure-of-eight stitches of 0 Vicryl.  We then proceed with posterior repair infiltrating the perineum and the posterior vaginal mucosa again using lidocaine 1% epinephrine 1:200,000. The vaginal mucosa was then incised medially and bluntly and sharply dissected from the prerectal fascia until we can mobilize the rectocele completely.  Rectocele was then corrected and plicated in multiple layers of U stitches of 2-0 Vicryl.  Excess posterior vaginal mucosa was excised and it was then closed using a running lock suture of 2-0 Vicryl.  Perineal muscles are then reapproximated with 3 simple sutures of 2-0 Vicryl and the skin was closed with subcuticular suture of 2-0 Vicryl.  I now leave Dr. Osborn Coho to proceed with a TVT which she will dictate in a different operative dictation.  Instruments and sponge count was complete x2.  Estimated blood loss was 200 mL.  A vaginal packing covered with Estrace cream was then positioned.  The procedure was well tolerated by the patient who was taken to recovery room in a well and stable condition.  SPECIMEN:  Uterus sent to pathology.     Crist Fat Kalie Cabral, M.D.     SAR/MEDQ  D:  01/10/2011  T:  01/11/2011  Job:  [id number]

## 2011-01-12 MED ORDER — PROMETHAZINE HCL 25 MG PO TABS: 12.5000 mg | ORAL_TABLET | Freq: Four times a day (QID) | ORAL | Status: DC | PRN

## 2011-01-12 MED ORDER — IBUPROFEN 600 MG PO TABS: 600.0000 mg | ORAL_TABLET | Freq: Four times a day (QID) | ORAL | Status: AC | PRN

## 2011-01-12 MED ORDER — PROMETHAZINE HCL 12.5 MG PO TABS: 12.5000 mg | ORAL_TABLET | Freq: Four times a day (QID) | ORAL | Status: AC | PRN

## 2011-01-12 MED ORDER — TRAMADOL HCL 50 MG PO TABS: 50.0000 mg | ORAL_TABLET | Freq: Four times a day (QID) | ORAL | Status: AC | PRN

## 2011-01-12 NOTE — Progress Notes (Signed)
UR Chart review completed.  

## 2011-01-12 NOTE — H&P (Signed)
Tanya Lloyd, [name]              ACCOUNT NO.:  [id number]  MEDICAL RECORD NO.:  [id number]  LOCATION:                                 FACILITY:  PHYSICIAN:  Dois Davenport A. Rivard, M.D. DATE OF BIRTH:  1960/09/22  DATE OF ADMISSION:  01/10/2011 DATE OF DISCHARGE:                             HISTORY & PHYSICAL   HISTORY OF PRESENT ILLNESS:  [name] is a 50 year old married white female, para 2-0-1-2, presenting for a total vaginal hysterectomy with anterior-posterior colporrhaphy and placement of tension-free vaginal tape because of symptomatic pelvic prolapse and mixed urinary incontinence.  For the past 3 years, the patient has had progressively worsening symptoms of the aforementioned diagnoses.  Her symptoms have been characterized as a sensation of heaviness in her lower abdomen (particularly with menses, leaking of urine when she coughs or sneezes and significant urinary urgency.  She denies intermenstrual bleeding, dyspareunia, vaginitis symptoms, dysuria, or pelvic pain.  For the past 5 years, the patient has worn a panty liner, due to her   "leaking" problems and has previously undergone physical therapy of the pelvic floor for the same.  More recently, the patient had urodynamic studies done (June 2012) that yielded results consistent with mixed urinary incontinence.  A pelvic ultrasound in June 2012 showed the uterus measuring 9.81 x 7.05 x 5.11 cm.  Both of her ovaries appeared normal on that study measuring (left) 2.41 x 2.04 x 1.46 cm and (right) 2.48 x 3.31 x 2.60 cm.  The patient's menstrual flow will last approximately 5 days, though she experienced 3 bleeding episodes in April 2012.  She changes her pad every 1-2 hours and has what she considers minimal cramping that is rated as a 4/10 on a 10-point pain scale.  After review of both medical and surgical management options given to the patient, she has decided to proceed with definitive therapy in the form of vaginal  hysterectomy, anteroposterior colporrhaphy, and placement of tension-free vaginal tape.  PAST MEDICAL HISTORY:  OB History:  Gravida 3, para 2-0-1-2.  The patient had a spontaneous vaginal birth in 1991 weighing 9 pounds and 8 ounces and in 1995, 8 pounds.  The patient also has one adopted child.  GYN History:  Menarche 57 years old.  Last menstrual period December 27, 2010.  She uses vasectomy as her method of contraception.  Denies any history of sexually transmitted diseases or abnormal Pap smears.  Her last normal Pap smear was June 2012.  Medical history:  Migraine headaches, particularly with menses, anxiety, vitamin D deficiency, mitral valve prolapse, anemia and asthma that is typically induced by illness.  Surgical History:  1966 tonsillectomy with adenoidectomy,  1993 D and E due to a missed abortion at 11-1/2 weeks,  2004 and 2006 left foot surgery.   She denies any history of blood transfusions or problems with anesthesia.  FAMILY HISTORY:  Cardiovascular disease, hypertension, brain cancer, diabetes, and stroke.  HABITS:  The patient does not use tobacco or illicit drugs.  She does consume approximately 2 alcoholic beverages per year.  SOCIAL HISTORY:  The patient is married.  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  CURRENT MEDICATIONS: 1. Cymbalta  30 mg daily. 2. Multivitamin daily. 3. Calcium daily. 4. Maxalt as needed.  The patient has no known drug allergies, however, she is sensitive to CODEINE and PENICILLIN both of which causes nausea and vomiting.  She does have a reaction to MSG that causes her syncope.  REVIEW OF SYSTEMS:  She denies any chest pain, shortness of breath, headache, or vision changes (except with migraines).  She does admit to random episodes of diarrhea, also left ankle swelling due to her previous surgery, but denies any other myalgias, arthralgias, skin rashes, vomiting, diarrhea, constipation, dyspareunia, or  vaginitis symptoms and except as mentioned in the history of present illness the patient's review of systems is otherwise negative.  PHYSICAL EXAMINATION:  VITAL SIGNS:  Blood pressure is 122/80, pulse is 72, respiration is 14, temperature is 97.8 degrees Fahrenheit orally, weight is 224 pounds, and height is 5 feet and 8-1/2 inches tall.  Body mass index is 33.6. GENERAL:  Neck is supple without masses.  There is no thyromegaly or cervical adenopathy. HEART:  Regular rate and rhythm. LUNGS:  Clear. BACK:  No CVA tenderness. ABDOMEN:  No tenderness, masses, or organomegaly. EXTREMITIES:  No clubbing, cyanosis, or edema. PELVIC:  EGBUS is normal.  Vagina shows a large amount of blood in the vaginal vault along with a 4/4 cystocele and 2/4 rectocele.  There is distance of the uterus to approximately 1 cm from the vaginal opening. Cervix is nontender without lesions.  Uterus appears normal size, shape, and consistency without tenderness (prolapse as above).  Adnexa, no tenderness or masses.  IMPRESSION: 1. Pelvic prolapse. 2. Mixed urinary incontinence.  DISPOSITION:  A discussion was held with the patient regarding indications for her procedures along with their risks which include but are not limited to reaction to anesthesia, damage to adjacent organs, infection, excessive bleeding, erosion of tension-free vaginal tape, and possible urinary retention.  The patient verbalized understanding of these risks and has consented to proceed with a total vaginal hysterectomy with anteroposterior colporrhaphy and placement of tension- free vaginal tape at [facility] on January 10, 2011, at 12:30 p.m.     [employer], P.A.-C   ______________________________ Crist Fat Rivard, M.D.   EJP/MEDQ  D:  12/28/2010  T:  12/29/2010  Job:  [id number]

## 2011-01-12 NOTE — Discharge Summary (Signed)
  Physician Discharge Summary  Patient ID: Tanya Lloyd MRN: [id number] DOB/AGE: [birth year] 51 y.o.  Admit date: 01/10/2011 Discharge date: 01/12/2011  Admission Diagnoses: pelvic prolapse;sui  Discharge Diagnoses: pelvic prolapse;sui        Active Problems:  * No active hospital problems. *    Discharged Condition: good  Hospital Course: Normal post-operative course  Consults: none  Treatments: surgery: Total vaginal hysterectomy,Anterior repair,Posterior repair,TVT   Disposition: Final discharge disposition not confirmed  Discharge Orders    Future Orders Please Complete By Expires   Discharge instructions      Comments:   Post-operative instruction to patient  Call [facility]  [phone])  for excessive pain, bleeding or temperature greater than or equal to 100.4 degrees (orally).    No driving for 1 weeks No lifting (more than 20 lbs) for 6 weeks No sexual activity for 6 weeks  Diet: normal Use COLACE 1 to 2 capsules twice daily as long as using pain medication to avoid constipation.  Bathing: may shower day after surgery  Wound Care: keep incisions clean and dry  Return to Dr. Estanislado Pandy on 02/22/11 at 4:15 pm  Return to work: To be determined at post operative visit.  AOZHYQ,MVHQIO A MD 01/12/2011 8:26 AM     Current Discharge Medication List    START taking these medications   Details  ibuprofen (ADVIL,MOTRIN) 600 MG tablet Take 1 tablet (600 mg total) by mouth every 6 (six) hours as needed for pain (take 4 times daily with food for 7 days then as needed for pain). Qty: 30 tablet, Refills: 1    traMADol (ULTRAM) 50 MG tablet Take 1 tablet (50 mg total) by mouth every 6 (six) hours as needed. Qty: 30 tablet, Refills: 1      CONTINUE these medications which have NOT CHANGED   Details  Calcium-Magnesium-Vitamin D 500-250-200 MG-MG-UNIT TABS Take 1 tablet by mouth daily.      DULoxetine (CYMBALTA) 30 MG capsule Take 30 mg by mouth  daily.      Glucosamine HCl (GLUCOSAMINE MAXIMUM STRENGTH) 1500 MG TABS Take 1 tablet by mouth daily.      Multiple Vitamins-Minerals (MULTIVITAMIN & MINERAL PO) Take 1 tablet by mouth daily.      rizatriptan (MAXALT) 10 MG tablet Take 10 mg by mouth as needed. For migraines. May repeat in 2 hours if needed          Signed: Tahani Potier A 01/12/2011, 8:39 AM

## 2011-01-12 NOTE — Progress Notes (Signed)
2 Days Post-Op Procedure(s) (LRB): HYSTERECTOMY VAGINAL (N/A) ANTERIOR (CYSTOCELE) AND POSTERIOR REPAIR (RECTOCELE) (N/A) TRANSVAGINAL TAPE (TVT) PROCEDURE (N/A)  Subjective: Patient reports that pain is well managed with Ultram. Tolerating normal diet as tolerated  diet without difficulty. No nausea / vomiting.  Ambulating and voiding.  Objective: BP 107/64  Pulse 75  Temp(Src) 98.6 F (37 C) (Oral)  Resp 18  Ht 5\' 9"  (1.753 m)  SpO2 93% Lungs: clear Heart: normal rate and rhythm Abdomen:soft and appropriately tender Extremities: Homans sign is negative, no sign of DVT Incision: normal  Assessment: s/p Procedure(s): HYSTERECTOMY VAGINAL ANTERIOR (CYSTOCELE) AND POSTERIOR REPAIR (RECTOCELE) TRANSVAGINAL TAPE (TVT) PROCEDURE: progressing well  Plan: Discharge home  LOS: 2 days    Jacquese Hackman A 01/12/2011, 8:42 AM

## 2011-01-12 NOTE — Progress Notes (Signed)
Pt given ibuprofen for pain and encouraged to ambulate and shower to determine if she will be ready for afternoon discharge home

## 2011-02-07 ENCOUNTER — Encounter (HOSPITAL_COMMUNITY): Payer: Self-pay | Admitting: Obstetrics and Gynecology

## 2012-04-08 ENCOUNTER — Other Ambulatory Visit: Payer: Self-pay | Admitting: Obstetrics and Gynecology

## 2012-04-14 ENCOUNTER — Other Ambulatory Visit: Payer: Self-pay | Admitting: Obstetrics and Gynecology

## 2012-04-22 ENCOUNTER — Other Ambulatory Visit: Payer: Self-pay | Admitting: Obstetrics and Gynecology

## 2012-04-24 ENCOUNTER — Other Ambulatory Visit: Payer: Self-pay | Admitting: Obstetrics and Gynecology

## 2012-04-28 ENCOUNTER — Telehealth: Payer: Self-pay

## 2012-04-28 NOTE — Telephone Encounter (Signed)
Faxed auth for 1 RF of Vesicare 5 mg tab # 30 to [facility]. Pt needs f/u visit, as she is overdue for AEX.  Melody Comas A

## 2012-06-05 ENCOUNTER — Other Ambulatory Visit: Payer: Self-pay | Admitting: Obstetrics and Gynecology

## 2012-06-10 ENCOUNTER — Encounter: Payer: Self-pay | Admitting: Obstetrics and Gynecology

## 2012-06-10 ENCOUNTER — Ambulatory Visit (INDEPENDENT_AMBULATORY_CARE_PROVIDER_SITE_OTHER): Payer: BC Managed Care – PPO | Admitting: Obstetrics and Gynecology

## 2012-06-10 VITALS — BP 106/68 | Resp 14 | Ht 69.0 in | Wt 191.0 lb

## 2012-06-10 DIAGNOSIS — N8189 Other female genital prolapse: Secondary | ICD-10-CM | POA: Insufficient documentation

## 2012-06-10 DIAGNOSIS — Z124 Encounter for screening for malignant neoplasm of cervix: Secondary | ICD-10-CM

## 2012-06-10 DIAGNOSIS — Z1231 Encounter for screening mammogram for malignant neoplasm of breast: Secondary | ICD-10-CM

## 2012-06-10 MED ORDER — SOLIFENACIN SUCCINATE 10 MG PO TABS: 10.0000 mg | ORAL_TABLET | Freq: Every day | ORAL | Status: AC

## 2012-06-10 NOTE — Progress Notes (Signed)
Patient ID: MERIDA ALCANTAR, female   DOB: 1961-03-05, 51 y.o.   MRN: [id number] Contraception HYST Last pap 2012 wnl Last Mammo 2 yrs ago. Pt states she does them q 2 yrs now. Last Colonoscopy never Last Dexa Scan with-in ten/ unsure. FHx of Osteoporosis. Primary MD Dr Severiano Gilbert Abuse at Home none  C/o urinary sxs but no leaking.  Wants to continue the vesicare.  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

## 2012-07-15 ENCOUNTER — Ambulatory Visit
Admission: RE | Admit: 2012-07-15 | Discharge: 2012-07-15 | Disposition: A | Discharge status: Home / Self Care | Payer: BC Managed Care – PPO | Source: Ambulatory Visit | Attending: Obstetrics and Gynecology | Admitting: Obstetrics and Gynecology

## 2012-07-15 DIAGNOSIS — Z1231 Encounter for screening mammogram for malignant neoplasm of breast: Secondary | ICD-10-CM

## 2013-07-28 ENCOUNTER — Other Ambulatory Visit: Payer: Self-pay

## 2013-07-28 DIAGNOSIS — Z1231 Encounter for screening mammogram for malignant neoplasm of breast: Secondary | ICD-10-CM

## 2013-08-13 ENCOUNTER — Ambulatory Visit: Payer: BC Managed Care – PPO

## 2013-12-14 ENCOUNTER — Ambulatory Visit
Admission: RE | Admit: 2013-12-14 | Discharge: 2013-12-14 | Disposition: A | Discharge status: Home / Self Care | Payer: BC Managed Care – PPO | Source: Ambulatory Visit

## 2013-12-14 ENCOUNTER — Encounter (INDEPENDENT_AMBULATORY_CARE_PROVIDER_SITE_OTHER): Payer: Self-pay

## 2013-12-14 DIAGNOSIS — Z1231 Encounter for screening mammogram for malignant neoplasm of breast: Secondary | ICD-10-CM

## 2016-04-02 DIAGNOSIS — G43829 Menstrual migraine, not intractable, without status migrainosus: Secondary | ICD-10-CM | POA: Diagnosis not present

## 2016-04-02 DIAGNOSIS — N3281 Overactive bladder: Secondary | ICD-10-CM | POA: Diagnosis not present

## 2016-04-02 DIAGNOSIS — F419 Anxiety disorder, unspecified: Secondary | ICD-10-CM | POA: Diagnosis not present

## 2016-04-18 DIAGNOSIS — Z01419 Encounter for gynecological examination (general) (routine) without abnormal findings: Secondary | ICD-10-CM | POA: Diagnosis not present

## 2016-04-18 DIAGNOSIS — R32 Unspecified urinary incontinence: Secondary | ICD-10-CM | POA: Diagnosis not present

## 2016-04-18 DIAGNOSIS — N83209 Unspecified ovarian cyst, unspecified side: Secondary | ICD-10-CM | POA: Diagnosis not present

## 2016-04-18 DIAGNOSIS — Z6832 Body mass index (BMI) 32.0-32.9, adult: Secondary | ICD-10-CM | POA: Diagnosis not present

## 2016-04-18 DIAGNOSIS — Z1231 Encounter for screening mammogram for malignant neoplasm of breast: Secondary | ICD-10-CM | POA: Diagnosis not present

## 2016-04-25 DIAGNOSIS — N838 Other noninflammatory disorders of ovary, fallopian tube and broad ligament: Secondary | ICD-10-CM | POA: Diagnosis not present

## 2016-10-01 DIAGNOSIS — N3281 Overactive bladder: Secondary | ICD-10-CM | POA: Diagnosis not present

## 2016-10-01 DIAGNOSIS — F419 Anxiety disorder, unspecified: Secondary | ICD-10-CM | POA: Diagnosis not present

## 2016-10-01 DIAGNOSIS — E785 Hyperlipidemia, unspecified: Secondary | ICD-10-CM | POA: Diagnosis not present

## 2016-11-06 DIAGNOSIS — J0100 Acute maxillary sinusitis, unspecified: Secondary | ICD-10-CM | POA: Diagnosis not present

## 2016-12-05 DIAGNOSIS — E785 Hyperlipidemia, unspecified: Secondary | ICD-10-CM | POA: Diagnosis not present

## 2016-12-17 DIAGNOSIS — R110 Nausea: Secondary | ICD-10-CM | POA: Diagnosis not present

## 2016-12-17 DIAGNOSIS — S70361A Insect bite (nonvenomous), right thigh, initial encounter: Secondary | ICD-10-CM | POA: Diagnosis not present

## 2016-12-17 DIAGNOSIS — R5383 Other fatigue: Secondary | ICD-10-CM | POA: Diagnosis not present

## 2016-12-20 DIAGNOSIS — N819 Female genital prolapse, unspecified: Secondary | ICD-10-CM | POA: Diagnosis not present

## 2016-12-20 DIAGNOSIS — N3946 Mixed incontinence: Secondary | ICD-10-CM | POA: Diagnosis not present

## 2017-04-03 DIAGNOSIS — N3281 Overactive bladder: Secondary | ICD-10-CM | POA: Diagnosis not present

## 2017-04-03 DIAGNOSIS — G43909 Migraine, unspecified, not intractable, without status migrainosus: Secondary | ICD-10-CM | POA: Diagnosis not present

## 2017-04-03 DIAGNOSIS — F419 Anxiety disorder, unspecified: Secondary | ICD-10-CM | POA: Diagnosis not present

## 2017-04-22 DIAGNOSIS — Z6834 Body mass index (BMI) 34.0-34.9, adult: Secondary | ICD-10-CM | POA: Diagnosis not present

## 2017-04-22 DIAGNOSIS — Z01419 Encounter for gynecological examination (general) (routine) without abnormal findings: Secondary | ICD-10-CM | POA: Diagnosis not present

## 2017-04-22 DIAGNOSIS — Z1231 Encounter for screening mammogram for malignant neoplasm of breast: Secondary | ICD-10-CM | POA: Diagnosis not present

## 2017-05-02 DIAGNOSIS — N819 Female genital prolapse, unspecified: Secondary | ICD-10-CM | POA: Diagnosis not present

## 2017-05-02 DIAGNOSIS — N3946 Mixed incontinence: Secondary | ICD-10-CM | POA: Diagnosis not present

## 2017-07-16 DIAGNOSIS — B085 Enteroviral vesicular pharyngitis: Secondary | ICD-10-CM | POA: Diagnosis not present

## 2017-08-21 DIAGNOSIS — N939 Abnormal uterine and vaginal bleeding, unspecified: Secondary | ICD-10-CM | POA: Diagnosis not present

## 2017-09-03 DIAGNOSIS — N898 Other specified noninflammatory disorders of vagina: Secondary | ICD-10-CM | POA: Diagnosis not present

## 2017-09-03 DIAGNOSIS — N939 Abnormal uterine and vaginal bleeding, unspecified: Secondary | ICD-10-CM | POA: Diagnosis not present

## 2018-01-22 DIAGNOSIS — E785 Hyperlipidemia, unspecified: Secondary | ICD-10-CM | POA: Diagnosis not present

## 2018-01-22 DIAGNOSIS — G43909 Migraine, unspecified, not intractable, without status migrainosus: Secondary | ICD-10-CM | POA: Diagnosis not present

## 2018-01-22 DIAGNOSIS — N3281 Overactive bladder: Secondary | ICD-10-CM | POA: Diagnosis not present

## 2018-01-22 DIAGNOSIS — F419 Anxiety disorder, unspecified: Secondary | ICD-10-CM | POA: Diagnosis not present

## 2018-04-28 DIAGNOSIS — R1032 Left lower quadrant pain: Secondary | ICD-10-CM | POA: Diagnosis not present

## 2018-04-28 DIAGNOSIS — Z1231 Encounter for screening mammogram for malignant neoplasm of breast: Secondary | ICD-10-CM | POA: Diagnosis not present

## 2018-04-28 DIAGNOSIS — N3941 Urge incontinence: Secondary | ICD-10-CM | POA: Diagnosis not present

## 2018-04-28 DIAGNOSIS — Z01419 Encounter for gynecological examination (general) (routine) without abnormal findings: Secondary | ICD-10-CM | POA: Diagnosis not present

## 2018-04-28 DIAGNOSIS — Z13820 Encounter for screening for osteoporosis: Secondary | ICD-10-CM | POA: Diagnosis not present

## 2018-07-07 DIAGNOSIS — M7672 Peroneal tendinitis, left leg: Secondary | ICD-10-CM | POA: Diagnosis not present

## 2018-08-04 DIAGNOSIS — M25572 Pain in left ankle and joints of left foot: Secondary | ICD-10-CM | POA: Diagnosis not present

## 2018-08-07 DIAGNOSIS — N3281 Overactive bladder: Secondary | ICD-10-CM | POA: Diagnosis not present

## 2018-08-07 DIAGNOSIS — Z0000 Encounter for general adult medical examination without abnormal findings: Secondary | ICD-10-CM | POA: Diagnosis not present

## 2018-08-07 DIAGNOSIS — E785 Hyperlipidemia, unspecified: Secondary | ICD-10-CM | POA: Diagnosis not present

## 2018-08-07 DIAGNOSIS — M25572 Pain in left ankle and joints of left foot: Secondary | ICD-10-CM | POA: Diagnosis not present

## 2018-08-07 DIAGNOSIS — F419 Anxiety disorder, unspecified: Secondary | ICD-10-CM | POA: Diagnosis not present

## 2018-08-08 DIAGNOSIS — M25572 Pain in left ankle and joints of left foot: Secondary | ICD-10-CM | POA: Diagnosis not present

## 2019-02-05 DIAGNOSIS — F419 Anxiety disorder, unspecified: Secondary | ICD-10-CM | POA: Diagnosis not present

## 2019-02-05 DIAGNOSIS — E785 Hyperlipidemia, unspecified: Secondary | ICD-10-CM | POA: Diagnosis not present

## 2019-02-05 DIAGNOSIS — N3281 Overactive bladder: Secondary | ICD-10-CM | POA: Diagnosis not present

## 2019-03-17 DIAGNOSIS — Z6832 Body mass index (BMI) 32.0-32.9, adult: Secondary | ICD-10-CM | POA: Diagnosis not present

## 2019-03-17 DIAGNOSIS — Z20828 Contact with and (suspected) exposure to other viral communicable diseases: Secondary | ICD-10-CM | POA: Diagnosis not present

## 2019-05-18 DIAGNOSIS — F331 Major depressive disorder, recurrent, moderate: Secondary | ICD-10-CM | POA: Diagnosis not present

## 2019-05-18 DIAGNOSIS — F4321 Adjustment disorder with depressed mood: Secondary | ICD-10-CM | POA: Diagnosis not present

## 2019-05-23 DIAGNOSIS — Z20828 Contact with and (suspected) exposure to other viral communicable diseases: Secondary | ICD-10-CM | POA: Diagnosis not present

## 2019-05-23 DIAGNOSIS — J3489 Other specified disorders of nose and nasal sinuses: Secondary | ICD-10-CM | POA: Diagnosis not present

## 2019-05-23 DIAGNOSIS — J069 Acute upper respiratory infection, unspecified: Secondary | ICD-10-CM | POA: Diagnosis not present

## 2019-06-17 DIAGNOSIS — Z1231 Encounter for screening mammogram for malignant neoplasm of breast: Secondary | ICD-10-CM | POA: Diagnosis not present

## 2019-06-17 DIAGNOSIS — Z1211 Encounter for screening for malignant neoplasm of colon: Secondary | ICD-10-CM | POA: Diagnosis not present

## 2019-06-17 DIAGNOSIS — Z01419 Encounter for gynecological examination (general) (routine) without abnormal findings: Secondary | ICD-10-CM | POA: Diagnosis not present

## 2019-06-17 DIAGNOSIS — Z6835 Body mass index (BMI) 35.0-35.9, adult: Secondary | ICD-10-CM | POA: Diagnosis not present

## 2019-08-12 DIAGNOSIS — Z0000 Encounter for general adult medical examination without abnormal findings: Secondary | ICD-10-CM | POA: Diagnosis not present

## 2019-08-12 DIAGNOSIS — E785 Hyperlipidemia, unspecified: Secondary | ICD-10-CM | POA: Diagnosis not present

## 2019-08-12 DIAGNOSIS — E041 Nontoxic single thyroid nodule: Secondary | ICD-10-CM | POA: Diagnosis not present

## 2019-08-12 DIAGNOSIS — F331 Major depressive disorder, recurrent, moderate: Secondary | ICD-10-CM | POA: Diagnosis not present

## 2019-08-12 DIAGNOSIS — F419 Anxiety disorder, unspecified: Secondary | ICD-10-CM | POA: Diagnosis not present

## 2019-08-26 DIAGNOSIS — E041 Nontoxic single thyroid nodule: Secondary | ICD-10-CM | POA: Diagnosis not present

## 2019-08-26 DIAGNOSIS — E042 Nontoxic multinodular goiter: Secondary | ICD-10-CM | POA: Diagnosis not present

## 2019-12-24 DIAGNOSIS — F4322 Adjustment disorder with anxiety: Secondary | ICD-10-CM | POA: Diagnosis not present

## 2019-12-28 DIAGNOSIS — F4322 Adjustment disorder with anxiety: Secondary | ICD-10-CM | POA: Diagnosis not present

## 2020-01-05 DIAGNOSIS — F4322 Adjustment disorder with anxiety: Secondary | ICD-10-CM | POA: Diagnosis not present

## 2020-01-13 DIAGNOSIS — F4322 Adjustment disorder with anxiety: Secondary | ICD-10-CM | POA: Diagnosis not present

## 2020-01-19 DIAGNOSIS — F4322 Adjustment disorder with anxiety: Secondary | ICD-10-CM | POA: Diagnosis not present

## 2020-02-04 DIAGNOSIS — J029 Acute pharyngitis, unspecified: Secondary | ICD-10-CM | POA: Diagnosis not present

## 2020-02-04 DIAGNOSIS — Z20822 Contact with and (suspected) exposure to covid-19: Secondary | ICD-10-CM | POA: Diagnosis not present

## 2020-02-18 DIAGNOSIS — F4322 Adjustment disorder with anxiety: Secondary | ICD-10-CM | POA: Diagnosis not present

## 2020-03-16 DIAGNOSIS — N3281 Overactive bladder: Secondary | ICD-10-CM | POA: Diagnosis not present

## 2020-03-16 DIAGNOSIS — Z23 Encounter for immunization: Secondary | ICD-10-CM | POA: Diagnosis not present

## 2020-03-16 DIAGNOSIS — F331 Major depressive disorder, recurrent, moderate: Secondary | ICD-10-CM | POA: Diagnosis not present

## 2020-03-16 DIAGNOSIS — F419 Anxiety disorder, unspecified: Secondary | ICD-10-CM | POA: Diagnosis not present

## 2020-03-16 DIAGNOSIS — E785 Hyperlipidemia, unspecified: Secondary | ICD-10-CM | POA: Diagnosis not present

## 2020-03-17 DIAGNOSIS — F4322 Adjustment disorder with anxiety: Secondary | ICD-10-CM | POA: Diagnosis not present

## 2020-03-24 DIAGNOSIS — F4322 Adjustment disorder with anxiety: Secondary | ICD-10-CM | POA: Diagnosis not present

## 2020-04-08 DIAGNOSIS — E785 Hyperlipidemia, unspecified: Secondary | ICD-10-CM | POA: Diagnosis not present

## 2020-04-21 ENCOUNTER — Ambulatory Visit: Payer: Self-pay

## 2020-04-21 ENCOUNTER — Ambulatory Visit: Payer: Self-pay | Attending: Internal Medicine

## 2020-04-21 DIAGNOSIS — Z23 Encounter for immunization: Secondary | ICD-10-CM

## 2020-04-21 NOTE — Progress Notes (Signed)
   Covid-19 Vaccination Clinic  Name:  Tanya Lloyd    MRN: [id number] DOB: Feb 21, 1961  04/21/2020  [name] was observed post Covid-19 immunization for 30 minutes based on pre-vaccination screening without incident. She was provided with Vaccine Information Sheet and instruction to access the V-Safe system.   [name] was instructed to call 911 with any severe reactions post vaccine: Marland Kitchen Difficulty breathing  . Swelling of face and throat  . A fast heartbeat  . A bad rash all over body  . Dizziness and weakness   Immunizations Administered    Name Date Dose VIS Date Route   JANSSEN COVID-19 VACCINE 04/21/2020  4:06 PM 0.5 mL 08/22/2019 Intramuscular   Manufacturer: [employer]   Lot: [id number]   NDC: 44975-300-51

## 2020-04-26 DIAGNOSIS — F4322 Adjustment disorder with anxiety: Secondary | ICD-10-CM | POA: Diagnosis not present

## 2020-04-28 DIAGNOSIS — H43812 Vitreous degeneration, left eye: Secondary | ICD-10-CM | POA: Diagnosis not present

## 2020-05-06 DIAGNOSIS — F4322 Adjustment disorder with anxiety: Secondary | ICD-10-CM | POA: Diagnosis not present

## 2020-05-17 DIAGNOSIS — F4322 Adjustment disorder with anxiety: Secondary | ICD-10-CM | POA: Diagnosis not present

## 2020-05-31 DIAGNOSIS — F4322 Adjustment disorder with anxiety: Secondary | ICD-10-CM | POA: Diagnosis not present

## 2020-08-02 DIAGNOSIS — F4322 Adjustment disorder with anxiety: Secondary | ICD-10-CM | POA: Diagnosis not present

## 2020-08-16 DIAGNOSIS — F4322 Adjustment disorder with anxiety: Secondary | ICD-10-CM | POA: Diagnosis not present

## 2020-08-30 DIAGNOSIS — F4322 Adjustment disorder with anxiety: Secondary | ICD-10-CM | POA: Diagnosis not present

## 2020-09-12 DIAGNOSIS — E041 Nontoxic single thyroid nodule: Secondary | ICD-10-CM | POA: Diagnosis not present

## 2020-09-12 DIAGNOSIS — Z0000 Encounter for general adult medical examination without abnormal findings: Secondary | ICD-10-CM | POA: Diagnosis not present

## 2020-09-12 DIAGNOSIS — G43909 Migraine, unspecified, not intractable, without status migrainosus: Secondary | ICD-10-CM | POA: Diagnosis not present

## 2020-09-12 DIAGNOSIS — F419 Anxiety disorder, unspecified: Secondary | ICD-10-CM | POA: Diagnosis not present

## 2020-09-12 DIAGNOSIS — E785 Hyperlipidemia, unspecified: Secondary | ICD-10-CM | POA: Diagnosis not present

## 2020-10-27 DIAGNOSIS — F4322 Adjustment disorder with anxiety: Secondary | ICD-10-CM | POA: Diagnosis not present

## 2020-11-09 DIAGNOSIS — F4322 Adjustment disorder with anxiety: Secondary | ICD-10-CM | POA: Diagnosis not present

## 2020-11-23 DIAGNOSIS — F4322 Adjustment disorder with anxiety: Secondary | ICD-10-CM | POA: Diagnosis not present

## 2020-12-01 DIAGNOSIS — F4322 Adjustment disorder with anxiety: Secondary | ICD-10-CM | POA: Diagnosis not present

## 2020-12-12 DIAGNOSIS — N3941 Urge incontinence: Secondary | ICD-10-CM | POA: Diagnosis not present

## 2020-12-12 DIAGNOSIS — Z01419 Encounter for gynecological examination (general) (routine) without abnormal findings: Secondary | ICD-10-CM | POA: Diagnosis not present

## 2020-12-12 DIAGNOSIS — Z1231 Encounter for screening mammogram for malignant neoplasm of breast: Secondary | ICD-10-CM | POA: Diagnosis not present

## 2020-12-12 DIAGNOSIS — N3281 Overactive bladder: Secondary | ICD-10-CM | POA: Diagnosis not present

## 2020-12-12 DIAGNOSIS — R195 Other fecal abnormalities: Secondary | ICD-10-CM | POA: Diagnosis not present

## 2020-12-12 DIAGNOSIS — Z1211 Encounter for screening for malignant neoplasm of colon: Secondary | ICD-10-CM | POA: Diagnosis not present

## 2020-12-13 DIAGNOSIS — F4322 Adjustment disorder with anxiety: Secondary | ICD-10-CM | POA: Diagnosis not present

## 2021-01-03 DIAGNOSIS — M8580 Other specified disorders of bone density and structure, unspecified site: Secondary | ICD-10-CM | POA: Diagnosis not present

## 2021-01-03 DIAGNOSIS — F4322 Adjustment disorder with anxiety: Secondary | ICD-10-CM | POA: Diagnosis not present

## 2021-01-03 DIAGNOSIS — M8589 Other specified disorders of bone density and structure, multiple sites: Secondary | ICD-10-CM | POA: Diagnosis not present

## 2021-01-05 DIAGNOSIS — K52831 Collagenous colitis: Secondary | ICD-10-CM | POA: Diagnosis not present

## 2021-01-05 DIAGNOSIS — R194 Change in bowel habit: Secondary | ICD-10-CM | POA: Diagnosis not present

## 2021-01-25 DIAGNOSIS — M8580 Other specified disorders of bone density and structure, unspecified site: Secondary | ICD-10-CM | POA: Diagnosis not present

## 2021-01-25 DIAGNOSIS — N3941 Urge incontinence: Secondary | ICD-10-CM | POA: Diagnosis not present

## 2021-01-27 DIAGNOSIS — F4322 Adjustment disorder with anxiety: Secondary | ICD-10-CM | POA: Diagnosis not present

## 2021-02-03 DIAGNOSIS — F4322 Adjustment disorder with anxiety: Secondary | ICD-10-CM | POA: Diagnosis not present

## 2021-02-18 DIAGNOSIS — R0981 Nasal congestion: Secondary | ICD-10-CM | POA: Diagnosis not present

## 2021-02-18 DIAGNOSIS — R059 Cough, unspecified: Secondary | ICD-10-CM | POA: Diagnosis not present

## 2021-02-18 DIAGNOSIS — R5383 Other fatigue: Secondary | ICD-10-CM | POA: Diagnosis not present

## 2021-02-18 DIAGNOSIS — U071 COVID-19: Secondary | ICD-10-CM | POA: Diagnosis not present

## 2021-03-09 DIAGNOSIS — F4322 Adjustment disorder with anxiety: Secondary | ICD-10-CM | POA: Diagnosis not present

## 2021-03-16 DIAGNOSIS — R350 Frequency of micturition: Secondary | ICD-10-CM | POA: Diagnosis not present

## 2021-03-16 DIAGNOSIS — N3941 Urge incontinence: Secondary | ICD-10-CM | POA: Diagnosis not present

## 2021-04-29 DIAGNOSIS — R059 Cough, unspecified: Secondary | ICD-10-CM | POA: Diagnosis not present

## 2021-04-29 DIAGNOSIS — Z03818 Encounter for observation for suspected exposure to other biological agents ruled out: Secondary | ICD-10-CM | POA: Diagnosis not present

## 2021-04-29 DIAGNOSIS — R6883 Chills (without fever): Secondary | ICD-10-CM | POA: Diagnosis not present

## 2021-04-29 DIAGNOSIS — B338 Other specified viral diseases: Secondary | ICD-10-CM | POA: Diagnosis not present

## 2021-04-29 DIAGNOSIS — R5383 Other fatigue: Secondary | ICD-10-CM | POA: Diagnosis not present

## 2021-06-01 DIAGNOSIS — N3941 Urge incontinence: Secondary | ICD-10-CM | POA: Diagnosis not present

## 2021-06-01 DIAGNOSIS — R350 Frequency of micturition: Secondary | ICD-10-CM | POA: Diagnosis not present

## 2021-06-08 DIAGNOSIS — F4322 Adjustment disorder with anxiety: Secondary | ICD-10-CM | POA: Diagnosis not present

## 2021-06-12 DIAGNOSIS — F4322 Adjustment disorder with anxiety: Secondary | ICD-10-CM | POA: Diagnosis not present

## 2021-07-14 DIAGNOSIS — J069 Acute upper respiratory infection, unspecified: Secondary | ICD-10-CM | POA: Diagnosis not present

## 2021-07-14 DIAGNOSIS — Z03818 Encounter for observation for suspected exposure to other biological agents ruled out: Secondary | ICD-10-CM | POA: Diagnosis not present

## 2021-07-14 DIAGNOSIS — J029 Acute pharyngitis, unspecified: Secondary | ICD-10-CM | POA: Diagnosis not present

## 2021-07-22 DIAGNOSIS — H6983 Other specified disorders of Eustachian tube, bilateral: Secondary | ICD-10-CM | POA: Diagnosis not present

## 2021-07-22 DIAGNOSIS — J40 Bronchitis, not specified as acute or chronic: Secondary | ICD-10-CM | POA: Diagnosis not present

## 2021-07-22 DIAGNOSIS — J329 Chronic sinusitis, unspecified: Secondary | ICD-10-CM | POA: Diagnosis not present

## 2021-08-03 DIAGNOSIS — F4322 Adjustment disorder with anxiety: Secondary | ICD-10-CM | POA: Diagnosis not present

## 2021-08-21 DIAGNOSIS — Z03818 Encounter for observation for suspected exposure to other biological agents ruled out: Secondary | ICD-10-CM | POA: Diagnosis not present

## 2021-08-21 DIAGNOSIS — B349 Viral infection, unspecified: Secondary | ICD-10-CM | POA: Diagnosis not present

## 2021-08-21 DIAGNOSIS — R059 Cough, unspecified: Secondary | ICD-10-CM | POA: Diagnosis not present

## 2021-08-21 DIAGNOSIS — R509 Fever, unspecified: Secondary | ICD-10-CM | POA: Diagnosis not present

## 2021-08-26 DIAGNOSIS — R052 Subacute cough: Secondary | ICD-10-CM | POA: Diagnosis not present

## 2021-08-27 DIAGNOSIS — R052 Subacute cough: Secondary | ICD-10-CM | POA: Diagnosis not present

## 2021-08-27 DIAGNOSIS — J22 Unspecified acute lower respiratory infection: Secondary | ICD-10-CM | POA: Diagnosis not present

## 2021-09-08 ENCOUNTER — Encounter: Payer: Self-pay | Admitting: Podiatry

## 2021-09-08 ENCOUNTER — Ambulatory Visit (INDEPENDENT_AMBULATORY_CARE_PROVIDER_SITE_OTHER): Payer: BC Managed Care – PPO | Admitting: Podiatry

## 2021-09-08 ENCOUNTER — Ambulatory Visit (INDEPENDENT_AMBULATORY_CARE_PROVIDER_SITE_OTHER): Payer: BC Managed Care – PPO

## 2021-09-08 ENCOUNTER — Other Ambulatory Visit: Payer: Self-pay

## 2021-09-08 DIAGNOSIS — M79672 Pain in left foot: Secondary | ICD-10-CM | POA: Diagnosis not present

## 2021-09-08 DIAGNOSIS — M21619 Bunion of unspecified foot: Secondary | ICD-10-CM

## 2021-09-08 DIAGNOSIS — M21611 Bunion of right foot: Secondary | ICD-10-CM

## 2021-09-08 DIAGNOSIS — M79674 Pain in right toe(s): Secondary | ICD-10-CM | POA: Diagnosis not present

## 2021-09-08 DIAGNOSIS — M170 Bilateral primary osteoarthritis of knee: Secondary | ICD-10-CM | POA: Diagnosis not present

## 2021-09-10 NOTE — Progress Notes (Signed)
Subjective:  ? ?Patient ID: Tanya Lloyd, female   DOB: 61 y.o.   MRN: [id number]  ? ?HPI ?Patient presents stating that this bunion on my right foot is increasingly sore and I know I need to get it taken care of.  The left when I had such trouble with the original surgery and you fix that and that has done well but the toes are all moving towards my big toe and the second toe is coming underneath it.  I do not have current pain in the left foot but it does have a lot of deformity but the right is very bothersome and wider shoes soaks and pads have not helped.  Patient does not smoke likes to be active ? ? ?Review of Systems  ?All other systems reviewed and are negative. ? ? ?   ?Objective:  ?Physical Exam ?Vitals and nursing note reviewed.  ?Constitutional:   ?   Appearance: She is well-developed.  ?Pulmonary:  ?   Effort: Pulmonary effort is normal.  ?Musculoskeletal:     ?   General: Normal range of motion.  ?Skin: ?   General: Skin is warm.  ?Neurological:  ?   Mental Status: She is alert.  ?  ?Neurovascular status intact muscle strength found to be adequate range of motion adequate.  Patient's right first metatarsal is red and it is irritated and there is an elongated hallux and she does have just unusual structural anatomy.  The left shows lots of incisions from previous surgery good range of motion of the first MPJ but the hallux has lifted and the second toe has come underneath it and all digits do have a medial rotation to the.  Good digital perfusion well oriented x3 ? ?   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 ?   ?Plan:  ?H&P x-rays reviewed and I discussed the right foot at great length.  I do think a combination of transpositional osteotomy and shortening osteotomy will be of value to her  and I explained procedure risk and what would be necessary.  She wants to get this done and she read consent form going over alternative treatments complications and understands all complications as outlined in the consent form.  Patient understands there is no long-term guarantee as far as success of surgery and is willing to accept risk of procedure left foot I do not recommend current surgery but at 1 point in future may require fusion and other procedures along with possible midfoot realignment ?   ? ? ?

## 2021-09-18 ENCOUNTER — Telehealth: Payer: Self-pay | Admitting: Urology

## 2021-09-18 NOTE — Telephone Encounter (Signed)
DOS - 10/17/21 ? ?[place] BUNIONECTOMY RIGHT --- [phone] ? ? ?[employer] EFFECTIVE DATE - 06/26/19 ? ?PLAN DEDUCTIBLE - $3,000.00 W/ $1,184.00 REMAINING ?OUT OF POCKET - $8,000.00 W/ $6,174.00 REMAINING ?COINSURANCE - 0% ?COPAY - $0.00 ? ? ?SPOKE WITH STEPHANIE WITH [employer] AND SHE STATED THAT FOR CPT CODE 68864 NO PRIOR AUTH IS REQUIRED. ? ?REF # I - [id number] ?

## 2021-10-16 MED ORDER — OXYCODONE-ACETAMINOPHEN 10-325 MG PO TABS: 1.0000 | ORAL_TABLET | ORAL | 0 refills | Status: AC | PRN

## 2021-10-16 MED ORDER — ONDANSETRON HCL 4 MG PO TABS: 4.0000 mg | ORAL_TABLET | Freq: Three times a day (TID) | ORAL | 0 refills | Status: AC | PRN

## 2021-10-16 NOTE — Addendum Note (Signed)
Addended by: Wallene Huh on: 10/16/2021 01:25 PM ? ? Modules accepted: Orders ? ?

## 2021-10-17 ENCOUNTER — Encounter: Payer: Self-pay | Admitting: Podiatry

## 2021-10-17 DIAGNOSIS — M2011 Hallux valgus (acquired), right foot: Secondary | ICD-10-CM | POA: Diagnosis not present

## 2021-10-18 ENCOUNTER — Telehealth: Payer: Self-pay | Admitting: *Deleted

## 2021-10-18 NOTE — Telephone Encounter (Signed)
Patient is requesting a prescription of meloxicam-15 mg instead of the narcotics, cannot take the oxycodone-ace.Please advise. ?

## 2021-10-19 ENCOUNTER — Other Ambulatory Visit: Payer: Self-pay | Admitting: Podiatry

## 2021-10-19 MED ORDER — MELOXICAM 15 MG PO TABS: 15.0000 mg | ORAL_TABLET | Freq: Every day | ORAL | 2 refills | Status: AC

## 2021-10-19 NOTE — Telephone Encounter (Signed)
I wrote the prescription and she can also take tylenol and a few ibuprophen as needed

## 2021-10-19 NOTE — Telephone Encounter (Signed)
Patient has been notified

## 2021-10-23 ENCOUNTER — Encounter: Payer: Self-pay | Admitting: Podiatry

## 2021-10-23 ENCOUNTER — Ambulatory Visit (INDEPENDENT_AMBULATORY_CARE_PROVIDER_SITE_OTHER): Payer: BC Managed Care – PPO

## 2021-10-23 ENCOUNTER — Ambulatory Visit (INDEPENDENT_AMBULATORY_CARE_PROVIDER_SITE_OTHER): Payer: BC Managed Care – PPO | Admitting: Podiatry

## 2021-10-23 DIAGNOSIS — Z9889 Other specified postprocedural states: Secondary | ICD-10-CM

## 2021-10-24 NOTE — Progress Notes (Signed)
Subjective:  ? ?Patient ID: Tanya Lloyd, female   DOB: 61 y.o.   MRN: [id number]  ? ?HPI ?Patient states she is doing very well after surgery with minimal discomfort and able to wear her boot without any problem ? ? ?ROS ? ? ?   ?Objective:  ?Physical Exam  ?Neurovascular status intact negative Bevelyn Buckles' sign noted right foot healing well wound edges coapted well good range of motion no crepitus ? ?   ?Assessment:  ?Doing well post shortening osteotomy with bunion correction right first metatarsal ? ?   ?Plan:  ?H&P x-rays reviewed and continue immobilization and dispensed surgical shoe.  Explained range of motion exercises and continued offloading of the area ? ?X-rays indicated satisfactory shortening and bunion correction right with good structural correction fixation in place ?   ? ? ?

## 2021-11-15 ENCOUNTER — Encounter: Payer: BC Managed Care – PPO | Admitting: Podiatry

## 2021-11-16 ENCOUNTER — Ambulatory Visit (INDEPENDENT_AMBULATORY_CARE_PROVIDER_SITE_OTHER): Payer: BC Managed Care – PPO | Admitting: Podiatry

## 2021-11-16 ENCOUNTER — Encounter: Payer: Self-pay | Admitting: Podiatry

## 2021-11-16 ENCOUNTER — Ambulatory Visit (INDEPENDENT_AMBULATORY_CARE_PROVIDER_SITE_OTHER): Payer: BC Managed Care – PPO

## 2021-11-16 DIAGNOSIS — Z9889 Other specified postprocedural states: Secondary | ICD-10-CM | POA: Diagnosis not present

## 2021-11-16 NOTE — Progress Notes (Signed)
Subjective:   Patient ID: Tanya Lloyd, female   DOB: 61 y.o.   MRN: [id number]   HPI Patient states doing great with surgery very pleased with how things have gone   ROS      Objective:  Physical Exam  Neurovascular status intact negative Bevelyn Buckles' sign noted wound edges well coapted excellent range of motion first MPJ no crepitus of the joint good alignment and reduction of deformity     Assessment:  Doing well post osteotomy first metatarsal right good alignment noted     Plan:  Reviewed x-rays with patient continue range of motion exercises return to soft shoe gear patient encouraged to call with questions concerns which may arise  X-rays indicate the osteotomy is healing well fixation in place and good shortening of the metatarsal reduction of IM angle also noted

## 2022-01-22 DIAGNOSIS — F419 Anxiety disorder, unspecified: Secondary | ICD-10-CM | POA: Diagnosis not present

## 2022-01-22 DIAGNOSIS — F331 Major depressive disorder, recurrent, moderate: Secondary | ICD-10-CM | POA: Diagnosis not present

## 2022-03-30 DIAGNOSIS — B9789 Other viral agents as the cause of diseases classified elsewhere: Secondary | ICD-10-CM | POA: Diagnosis not present

## 2022-03-30 DIAGNOSIS — J019 Acute sinusitis, unspecified: Secondary | ICD-10-CM | POA: Diagnosis not present

## 2022-06-01 DIAGNOSIS — J018 Other acute sinusitis: Secondary | ICD-10-CM | POA: Diagnosis not present

## 2022-06-12 DIAGNOSIS — K5730 Diverticulosis of large intestine without perforation or abscess without bleeding: Secondary | ICD-10-CM | POA: Diagnosis not present

## 2022-06-12 DIAGNOSIS — R635 Abnormal weight gain: Secondary | ICD-10-CM | POA: Diagnosis not present

## 2022-06-12 DIAGNOSIS — R152 Fecal urgency: Secondary | ICD-10-CM | POA: Diagnosis not present

## 2022-06-12 DIAGNOSIS — K52831 Collagenous colitis: Secondary | ICD-10-CM | POA: Diagnosis not present

## 2022-06-15 DIAGNOSIS — N3941 Urge incontinence: Secondary | ICD-10-CM | POA: Diagnosis not present

## 2022-06-15 DIAGNOSIS — R350 Frequency of micturition: Secondary | ICD-10-CM | POA: Diagnosis not present

## 2022-07-25 DIAGNOSIS — G8929 Other chronic pain: Secondary | ICD-10-CM | POA: Diagnosis not present

## 2022-07-25 DIAGNOSIS — M25552 Pain in left hip: Secondary | ICD-10-CM | POA: Diagnosis not present

## 2022-07-25 DIAGNOSIS — M25561 Pain in right knee: Secondary | ICD-10-CM | POA: Diagnosis not present

## 2022-07-25 DIAGNOSIS — M791 Myalgia, unspecified site: Secondary | ICD-10-CM | POA: Diagnosis not present

## 2022-07-25 DIAGNOSIS — M25562 Pain in left knee: Secondary | ICD-10-CM | POA: Diagnosis not present

## 2022-08-14 DIAGNOSIS — E785 Hyperlipidemia, unspecified: Secondary | ICD-10-CM | POA: Diagnosis not present

## 2022-08-14 DIAGNOSIS — Z0000 Encounter for general adult medical examination without abnormal findings: Secondary | ICD-10-CM | POA: Diagnosis not present

## 2022-08-14 DIAGNOSIS — E559 Vitamin D deficiency, unspecified: Secondary | ICD-10-CM | POA: Diagnosis not present

## 2022-08-14 DIAGNOSIS — N8110 Cystocele, unspecified: Secondary | ICD-10-CM | POA: Diagnosis not present

## 2022-08-14 DIAGNOSIS — F331 Major depressive disorder, recurrent, moderate: Secondary | ICD-10-CM | POA: Diagnosis not present

## 2022-08-16 DIAGNOSIS — M25562 Pain in left knee: Secondary | ICD-10-CM | POA: Diagnosis not present

## 2022-08-16 DIAGNOSIS — M25552 Pain in left hip: Secondary | ICD-10-CM | POA: Diagnosis not present

## 2022-08-16 DIAGNOSIS — R2242 Localized swelling, mass and lump, left lower limb: Secondary | ICD-10-CM | POA: Diagnosis not present

## 2022-08-16 DIAGNOSIS — M25561 Pain in right knee: Secondary | ICD-10-CM | POA: Diagnosis not present

## 2022-09-05 DIAGNOSIS — M79652 Pain in left thigh: Secondary | ICD-10-CM | POA: Diagnosis not present

## 2022-09-18 ENCOUNTER — Other Ambulatory Visit: Payer: Self-pay | Admitting: Sports Medicine

## 2022-09-18 ENCOUNTER — Ambulatory Visit
Admission: RE | Admit: 2022-09-18 | Discharge: 2022-09-18 | Disposition: A | Discharge status: Home / Self Care | Payer: BC Managed Care – PPO | Source: Ambulatory Visit | Attending: Sports Medicine | Admitting: Sports Medicine

## 2022-09-18 DIAGNOSIS — M25552 Pain in left hip: Secondary | ICD-10-CM

## 2022-09-18 DIAGNOSIS — M1712 Unilateral primary osteoarthritis, left knee: Secondary | ICD-10-CM | POA: Diagnosis not present

## 2022-09-18 DIAGNOSIS — M25561 Pain in right knee: Secondary | ICD-10-CM

## 2022-09-18 DIAGNOSIS — M25562 Pain in left knee: Secondary | ICD-10-CM

## 2022-09-18 DIAGNOSIS — M1711 Unilateral primary osteoarthritis, right knee: Secondary | ICD-10-CM | POA: Diagnosis not present

## 2022-09-18 DIAGNOSIS — M79652 Pain in left thigh: Secondary | ICD-10-CM | POA: Diagnosis not present

## 2022-09-26 DIAGNOSIS — Z6830 Body mass index (BMI) 30.0-30.9, adult: Secondary | ICD-10-CM | POA: Diagnosis not present

## 2022-09-26 DIAGNOSIS — E559 Vitamin D deficiency, unspecified: Secondary | ICD-10-CM | POA: Diagnosis not present

## 2022-09-26 DIAGNOSIS — E785 Hyperlipidemia, unspecified: Secondary | ICD-10-CM | POA: Diagnosis not present

## 2022-09-26 DIAGNOSIS — E6609 Other obesity due to excess calories: Secondary | ICD-10-CM | POA: Diagnosis not present

## 2022-10-01 DIAGNOSIS — M79652 Pain in left thigh: Secondary | ICD-10-CM | POA: Diagnosis not present

## 2022-10-08 DIAGNOSIS — M25552 Pain in left hip: Secondary | ICD-10-CM | POA: Diagnosis not present

## 2022-10-15 DIAGNOSIS — M79652 Pain in left thigh: Secondary | ICD-10-CM | POA: Diagnosis not present

## 2022-11-21 DIAGNOSIS — N959 Unspecified menopausal and perimenopausal disorder: Secondary | ICD-10-CM | POA: Diagnosis not present

## 2022-11-21 DIAGNOSIS — E785 Hyperlipidemia, unspecified: Secondary | ICD-10-CM | POA: Diagnosis not present

## 2022-11-21 DIAGNOSIS — E611 Iron deficiency: Secondary | ICD-10-CM | POA: Diagnosis not present

## 2022-11-21 DIAGNOSIS — E559 Vitamin D deficiency, unspecified: Secondary | ICD-10-CM | POA: Diagnosis not present

## 2022-11-21 DIAGNOSIS — M2550 Pain in unspecified joint: Secondary | ICD-10-CM | POA: Diagnosis not present

## 2022-11-21 DIAGNOSIS — E538 Deficiency of other specified B group vitamins: Secondary | ICD-10-CM | POA: Diagnosis not present

## 2022-11-21 DIAGNOSIS — R5383 Other fatigue: Secondary | ICD-10-CM | POA: Diagnosis not present

## 2022-11-21 DIAGNOSIS — Z131 Encounter for screening for diabetes mellitus: Secondary | ICD-10-CM | POA: Diagnosis not present

## 2022-11-21 DIAGNOSIS — E039 Hypothyroidism, unspecified: Secondary | ICD-10-CM | POA: Diagnosis not present

## 2022-12-19 DIAGNOSIS — R519 Headache, unspecified: Secondary | ICD-10-CM | POA: Diagnosis not present

## 2022-12-19 DIAGNOSIS — E785 Hyperlipidemia, unspecified: Secondary | ICD-10-CM | POA: Diagnosis not present

## 2022-12-19 DIAGNOSIS — R5383 Other fatigue: Secondary | ICD-10-CM | POA: Diagnosis not present

## 2022-12-19 DIAGNOSIS — F419 Anxiety disorder, unspecified: Secondary | ICD-10-CM | POA: Diagnosis not present

## 2023-02-16 DIAGNOSIS — J018 Other acute sinusitis: Secondary | ICD-10-CM | POA: Diagnosis not present

## 2023-02-16 DIAGNOSIS — R058 Other specified cough: Secondary | ICD-10-CM | POA: Diagnosis not present

## 2023-02-16 DIAGNOSIS — R051 Acute cough: Secondary | ICD-10-CM | POA: Diagnosis not present

## 2023-02-16 DIAGNOSIS — R062 Wheezing: Secondary | ICD-10-CM | POA: Diagnosis not present

## 2023-02-16 DIAGNOSIS — R0989 Other specified symptoms and signs involving the circulatory and respiratory systems: Secondary | ICD-10-CM | POA: Diagnosis not present

## 2023-03-06 DIAGNOSIS — F419 Anxiety disorder, unspecified: Secondary | ICD-10-CM | POA: Diagnosis not present

## 2023-03-06 DIAGNOSIS — E611 Iron deficiency: Secondary | ICD-10-CM | POA: Diagnosis not present

## 2023-03-06 DIAGNOSIS — N959 Unspecified menopausal and perimenopausal disorder: Secondary | ICD-10-CM | POA: Diagnosis not present

## 2023-03-06 DIAGNOSIS — E039 Hypothyroidism, unspecified: Secondary | ICD-10-CM | POA: Diagnosis not present

## 2023-03-06 DIAGNOSIS — R5383 Other fatigue: Secondary | ICD-10-CM | POA: Diagnosis not present

## 2023-03-06 DIAGNOSIS — E785 Hyperlipidemia, unspecified: Secondary | ICD-10-CM | POA: Diagnosis not present

## 2023-03-06 DIAGNOSIS — M2550 Pain in unspecified joint: Secondary | ICD-10-CM | POA: Diagnosis not present

## 2023-03-06 DIAGNOSIS — E538 Deficiency of other specified B group vitamins: Secondary | ICD-10-CM | POA: Diagnosis not present

## 2023-03-06 DIAGNOSIS — E559 Vitamin D deficiency, unspecified: Secondary | ICD-10-CM | POA: Diagnosis not present

## 2023-03-14 DIAGNOSIS — F331 Major depressive disorder, recurrent, moderate: Secondary | ICD-10-CM | POA: Diagnosis not present

## 2023-03-14 DIAGNOSIS — N3281 Overactive bladder: Secondary | ICD-10-CM | POA: Diagnosis not present

## 2023-03-21 DIAGNOSIS — F419 Anxiety disorder, unspecified: Secondary | ICD-10-CM | POA: Diagnosis not present

## 2023-03-21 DIAGNOSIS — R519 Headache, unspecified: Secondary | ICD-10-CM | POA: Diagnosis not present

## 2023-03-21 DIAGNOSIS — R5383 Other fatigue: Secondary | ICD-10-CM | POA: Diagnosis not present

## 2023-03-21 DIAGNOSIS — E785 Hyperlipidemia, unspecified: Secondary | ICD-10-CM | POA: Diagnosis not present

## 2023-06-28 DIAGNOSIS — R519 Headache, unspecified: Secondary | ICD-10-CM | POA: Diagnosis not present

## 2023-06-28 DIAGNOSIS — E039 Hypothyroidism, unspecified: Secondary | ICD-10-CM | POA: Diagnosis not present

## 2023-06-28 DIAGNOSIS — E785 Hyperlipidemia, unspecified: Secondary | ICD-10-CM | POA: Diagnosis not present

## 2023-06-28 DIAGNOSIS — M2550 Pain in unspecified joint: Secondary | ICD-10-CM | POA: Diagnosis not present

## 2023-06-28 DIAGNOSIS — F419 Anxiety disorder, unspecified: Secondary | ICD-10-CM | POA: Diagnosis not present

## 2023-06-28 DIAGNOSIS — R5383 Other fatigue: Secondary | ICD-10-CM | POA: Diagnosis not present

## 2023-06-28 DIAGNOSIS — E669 Obesity, unspecified: Secondary | ICD-10-CM | POA: Diagnosis not present

## 2023-06-28 DIAGNOSIS — E611 Iron deficiency: Secondary | ICD-10-CM | POA: Diagnosis not present

## 2023-06-28 DIAGNOSIS — D513 Other dietary vitamin B12 deficiency anemia: Secondary | ICD-10-CM | POA: Diagnosis not present

## 2023-07-17 DIAGNOSIS — R519 Headache, unspecified: Secondary | ICD-10-CM | POA: Diagnosis not present

## 2023-07-17 DIAGNOSIS — R5383 Other fatigue: Secondary | ICD-10-CM | POA: Diagnosis not present

## 2023-07-17 DIAGNOSIS — E785 Hyperlipidemia, unspecified: Secondary | ICD-10-CM | POA: Diagnosis not present

## 2023-07-17 DIAGNOSIS — F419 Anxiety disorder, unspecified: Secondary | ICD-10-CM | POA: Diagnosis not present

## 2023-07-20 DIAGNOSIS — Z03818 Encounter for observation for suspected exposure to other biological agents ruled out: Secondary | ICD-10-CM | POA: Diagnosis not present

## 2023-07-20 DIAGNOSIS — J040 Acute laryngitis: Secondary | ICD-10-CM | POA: Diagnosis not present

## 2023-07-20 DIAGNOSIS — R051 Acute cough: Secondary | ICD-10-CM | POA: Diagnosis not present

## 2023-07-20 DIAGNOSIS — R0981 Nasal congestion: Secondary | ICD-10-CM | POA: Diagnosis not present

## 2023-07-29 DIAGNOSIS — E039 Hypothyroidism, unspecified: Secondary | ICD-10-CM | POA: Diagnosis not present

## 2023-07-29 DIAGNOSIS — E611 Iron deficiency: Secondary | ICD-10-CM | POA: Diagnosis not present

## 2023-07-29 DIAGNOSIS — E538 Deficiency of other specified B group vitamins: Secondary | ICD-10-CM | POA: Diagnosis not present

## 2023-07-29 DIAGNOSIS — N959 Unspecified menopausal and perimenopausal disorder: Secondary | ICD-10-CM | POA: Diagnosis not present

## 2023-07-29 DIAGNOSIS — R5383 Other fatigue: Secondary | ICD-10-CM | POA: Diagnosis not present

## 2023-08-05 ENCOUNTER — Encounter: Payer: Self-pay | Admitting: Obstetrics and Gynecology

## 2023-08-05 DIAGNOSIS — Z1231 Encounter for screening mammogram for malignant neoplasm of breast: Secondary | ICD-10-CM

## 2023-08-06 ENCOUNTER — Other Ambulatory Visit: Payer: Self-pay | Admitting: Obstetrics and Gynecology

## 2023-08-06 DIAGNOSIS — Z1231 Encounter for screening mammogram for malignant neoplasm of breast: Secondary | ICD-10-CM

## 2023-08-23 DIAGNOSIS — Z03818 Encounter for observation for suspected exposure to other biological agents ruled out: Secondary | ICD-10-CM | POA: Diagnosis not present

## 2023-08-23 DIAGNOSIS — R051 Acute cough: Secondary | ICD-10-CM | POA: Diagnosis not present

## 2023-08-23 DIAGNOSIS — R52 Pain, unspecified: Secondary | ICD-10-CM | POA: Diagnosis not present

## 2023-08-23 DIAGNOSIS — J029 Acute pharyngitis, unspecified: Secondary | ICD-10-CM | POA: Diagnosis not present

## 2023-08-23 DIAGNOSIS — R6883 Chills (without fever): Secondary | ICD-10-CM | POA: Diagnosis not present

## 2023-08-23 DIAGNOSIS — R5383 Other fatigue: Secondary | ICD-10-CM | POA: Diagnosis not present

## 2023-08-27 DIAGNOSIS — J988 Other specified respiratory disorders: Secondary | ICD-10-CM | POA: Diagnosis not present

## 2023-09-03 DIAGNOSIS — N3281 Overactive bladder: Secondary | ICD-10-CM | POA: Diagnosis not present

## 2023-09-03 DIAGNOSIS — Z1330 Encounter for screening examination for mental health and behavioral disorders, unspecified: Secondary | ICD-10-CM | POA: Diagnosis not present

## 2023-09-03 DIAGNOSIS — Z01419 Encounter for gynecological examination (general) (routine) without abnormal findings: Secondary | ICD-10-CM | POA: Diagnosis not present

## 2023-09-03 DIAGNOSIS — M8580 Other specified disorders of bone density and structure, unspecified site: Secondary | ICD-10-CM | POA: Diagnosis not present

## 2023-09-20 DIAGNOSIS — N3941 Urge incontinence: Secondary | ICD-10-CM | POA: Diagnosis not present

## 2023-09-20 DIAGNOSIS — R350 Frequency of micturition: Secondary | ICD-10-CM | POA: Diagnosis not present

## 2023-10-01 DIAGNOSIS — D513 Other dietary vitamin B12 deficiency anemia: Secondary | ICD-10-CM | POA: Diagnosis not present

## 2023-10-01 DIAGNOSIS — E669 Obesity, unspecified: Secondary | ICD-10-CM | POA: Diagnosis not present

## 2023-10-01 DIAGNOSIS — R519 Headache, unspecified: Secondary | ICD-10-CM | POA: Diagnosis not present

## 2023-10-01 DIAGNOSIS — E611 Iron deficiency: Secondary | ICD-10-CM | POA: Diagnosis not present

## 2023-10-01 DIAGNOSIS — M2550 Pain in unspecified joint: Secondary | ICD-10-CM | POA: Diagnosis not present

## 2023-10-01 DIAGNOSIS — R5383 Other fatigue: Secondary | ICD-10-CM | POA: Diagnosis not present

## 2023-10-01 DIAGNOSIS — F419 Anxiety disorder, unspecified: Secondary | ICD-10-CM | POA: Diagnosis not present

## 2023-10-01 DIAGNOSIS — E039 Hypothyroidism, unspecified: Secondary | ICD-10-CM | POA: Diagnosis not present

## 2023-10-01 DIAGNOSIS — E785 Hyperlipidemia, unspecified: Secondary | ICD-10-CM | POA: Diagnosis not present

## 2023-10-07 DIAGNOSIS — Z780 Asymptomatic menopausal state: Secondary | ICD-10-CM | POA: Diagnosis not present

## 2023-10-07 DIAGNOSIS — M85852 Other specified disorders of bone density and structure, left thigh: Secondary | ICD-10-CM | POA: Diagnosis not present

## 2023-10-16 DIAGNOSIS — R519 Headache, unspecified: Secondary | ICD-10-CM | POA: Diagnosis not present

## 2023-10-16 DIAGNOSIS — R5383 Other fatigue: Secondary | ICD-10-CM | POA: Diagnosis not present

## 2023-10-16 DIAGNOSIS — E785 Hyperlipidemia, unspecified: Secondary | ICD-10-CM | POA: Diagnosis not present

## 2023-10-16 DIAGNOSIS — F419 Anxiety disorder, unspecified: Secondary | ICD-10-CM | POA: Diagnosis not present

## 2023-11-07 ENCOUNTER — Ambulatory Visit (INDEPENDENT_AMBULATORY_CARE_PROVIDER_SITE_OTHER): Admitting: Audiology

## 2023-11-07 ENCOUNTER — Ambulatory Visit (INDEPENDENT_AMBULATORY_CARE_PROVIDER_SITE_OTHER): Admitting: Otolaryngology

## 2023-11-07 VITALS — BP 130/85 | HR 87 | Ht 68.0 in | Wt 205.0 lb

## 2023-11-07 DIAGNOSIS — H6983 Other specified disorders of Eustachian tube, bilateral: Secondary | ICD-10-CM | POA: Diagnosis not present

## 2023-11-07 DIAGNOSIS — J310 Chronic rhinitis: Secondary | ICD-10-CM

## 2023-11-07 DIAGNOSIS — R0981 Nasal congestion: Secondary | ICD-10-CM

## 2023-11-07 DIAGNOSIS — Z0110 Encounter for examination of ears and hearing without abnormal findings: Secondary | ICD-10-CM | POA: Diagnosis not present

## 2023-11-07 DIAGNOSIS — H903 Sensorineural hearing loss, bilateral: Secondary | ICD-10-CM | POA: Diagnosis not present

## 2023-11-07 DIAGNOSIS — H93293 Other abnormal auditory perceptions, bilateral: Secondary | ICD-10-CM

## 2023-11-07 MED ORDER — FLUTICASONE PROPIONATE 50 MCG/ACT NA SUSP: 2.0000 | Freq: Every day | NASAL | 10 refills | Status: AC

## 2023-11-07 NOTE — Progress Notes (Signed)
  [address] [place], [place] [address] [phone]  Audiological Evaluation    Name: Tanya Lloyd     DOB:   03/18/1961      MRN:   [id number]                                                                                     Service Date: 11/07/2023     Accompanied by: unaccompanied   Patient comes today after Dr. Darlin Ehrlich, ENT sent a referral for a hearing evaluation due to concerns with ear fullness.   Symptoms Yes Details  Hearing loss  [x]  Perceives hearing decline when is stopped up  Tinnitus  []    Ear pain/ infections/pressure  [x]  Stopped up sensation - reports it improves if she takes allergy medication  Balance problems  [x]  Reports she has always had motion sickness and poor balance/coordination  Noise exposure history  []    Previous ear surgeries  []    Family history of hearing loss  [x]  Father -unclear etiology - may have had occupational noise exposure  Amplification  []    Other  []      Otoscopy: Right ear: Clear external ear canals and notable landmarks visualized on the tympanic membrane. Left ear:  Clear external ear canals and notable landmarks visualized on the tympanic membrane.  Tympanometry: Right ear: Type Ad- Normal external ear canal volume with normal middle ear pressure and high tympanic membrane compliance. Left ear: Type Ad- Normal external ear canal volume with normal middle ear pressure and high tympanic membrane compliance.    Pure tone Audiometry: Right ear- Normal hearing from [phone] Hz in both ears,with a slight air-bone gap at 4000 Hz in the right ear.    Speech Audiometry: Right ear- Speech Reception Threshold (SRT) was obtained at 10 dBHL. Left ear-Speech Reception Threshold (SRT) was obtained at 10 dBHL.   Word Recognition Score Tested using NU-6 (MLV) Right ear: 100% was obtained at a presentation level of 55 dBHL with contralateral masking which is deemed as  excellent. Left ear: 100% was obtained at a  presentation level of 55 dBHL with contralateral masking which is deemed as  excellent.   The hearing test results were completed under headphones and re-checked with inserts and results are deemed to be of good reliability. Test technique:  conventional      Recommendations: Follow up with ENT as scheduled for today., Return for a hearing evaluation if concerns with hearing changes arise or per MD recommendation.   Syrah Daughtrey MARIE LEROUX-MARTINEZ, AUD

## 2023-11-09 DIAGNOSIS — J310 Chronic rhinitis: Secondary | ICD-10-CM | POA: Insufficient documentation

## 2023-11-09 DIAGNOSIS — H6983 Other specified disorders of Eustachian tube, bilateral: Secondary | ICD-10-CM | POA: Insufficient documentation

## 2023-11-09 DIAGNOSIS — H903 Sensorineural hearing loss, bilateral: Secondary | ICD-10-CM | POA: Insufficient documentation

## 2023-11-09 NOTE — Progress Notes (Signed)
 CC: Clogging sensation in ears, muffled hearing  HPI:  Tanya Lloyd is a 63 y.o. female who presents today complaining of clogging sensation in her ears and muffled hearing for the past 2 years.  She has a history of environmental allergies.  She is using Claritin daily.  She denies any recent known otitis media or otitis externa.  She underwent adenotonsillectomy surgery as a child.  She has no previous otologic surgery.  Currently she denies any significant otalgia, otorrhea, or vertigo.  Past Medical History:  Diagnosis Date   Headache(784.0)    migraines   Vitamin D deficiency     Past Surgical History:  Procedure Laterality Date   ANTERIOR AND POSTERIOR REPAIR  01/10/2011   Procedure: ANTERIOR (CYSTOCELE) AND POSTERIOR REPAIR (RECTOCELE);  Surgeon: Tanya Spells, Lloyd;  Location: [facility];  Service: Gynecology;  Laterality: N/A;   BLADDER SUSPENSION  01/10/2011   Procedure: TRANSVAGINAL TAPE (TVT) PROCEDURE;  Surgeon: Tanya Schanz, Lloyd;  Location: [facility];  Service: Gynecology;  Laterality: N/A;  with Cystoscopy   FOOT SURGERY     x 2   TONSILLECTOMY     VAGINAL HYSTERECTOMY  01/10/2011   Procedure: HYSTERECTOMY VAGINAL;  Surgeon: Tanya Spells, Lloyd;  Location: [facility];  Service: Gynecology;  Laterality: N/A;    No family history on file.  Social History:  reports that she has never smoked. She does not have any smokeless tobacco history on file. She reports current alcohol use of about 1.0 standard drink of alcohol per week. No history on file for drug use.  Allergies:  Allergies  Allergen Reactions   Other     ALLERGIC TO ALL NARCOTICS   Codeine Nausea And Vomiting and Other (See Comments)    dizziness    Prior to Admission medications   Medication Sig Start Date End Date Taking? Authorizing Provider  Calcium-Magnesium-Vitamin D 500-250-200 MG-MG-UNIT TABS Take 1 tablet by mouth daily.     Yes Provider, Historical, Lloyd  DULoxetine  (CYMBALTA ) 30 MG capsule Take 30 mg by  mouth daily.     Yes Provider, Historical, Lloyd  fluticasone  (FLONASE ) 50 MCG/ACT nasal spray Place 2 sprays into both nostrils daily. 11/07/23 12/07/23 Yes Tanya Caves, Lloyd  Glucosamine HCl (GLUCOSAMINE MAXIMUM STRENGTH) 1500 MG TABS Take 1 tablet by mouth daily.     Yes Provider, Historical, Lloyd  Multiple Vitamins-Minerals (MULTIVITAMIN & MINERAL PO) Take 1 tablet by mouth daily.     Yes Provider, Historical, Lloyd  rizatriptan (MAXALT) 10 MG tablet Take 10 mg by mouth as needed. For migraines. May repeat in 2 hours if needed    Yes Provider, Historical, Lloyd  meloxicam  (MOBIC ) 15 MG tablet Take 1 tablet (15 mg total) by mouth daily. 10/19/21   Tanya Lloyd, DPM  ondansetron  (ZOFRAN ) 4 MG tablet Take 1 tablet (4 mg total) by mouth every 8 (eight) hours as needed for nausea or vomiting. 10/16/21   Tanya Lloyd, DPM  oxyCODONE -acetaminophen  (PERCOCET) 10-325 MG tablet Take 1 tablet by mouth every 4 (four) hours as needed for pain. 10/16/21   Tanya Lloyd, DPM  solifenacin  (VESICARE ) 10 MG tablet Take 1 tablet (10 mg total) by mouth daily. 06/10/12   Tanya Carrion, Lloyd    Blood pressure 130/85, pulse 87, height 5\' 8"  (1.727 m), weight 205 lb (93 kg), last menstrual period 12/30/2010, SpO2 96%. Exam: General: Communicates without difficulty, well nourished, no acute distress. Head: Normocephalic, no evidence injury, no tenderness, facial buttresses intact  without stepoff. Face/sinus: No tenderness to palpation and percussion. Facial movement is normal and symmetric. Eyes: PERRL, EOMI. No scleral icterus, conjunctivae clear. Neuro: CN II exam reveals vision grossly intact.  No nystagmus at any point of gaze. Ears: Auricles well formed without lesions.  Ear canals are intact without mass or lesion.  No erythema or edema is appreciated.  The TMs are intact without fluid. Nose: External evaluation reveals normal support and skin without lesions.  Dorsum is intact.  Anterior rhinoscopy reveals congested mucosa over  anterior aspect of inferior turbinates and intact septum.  No purulence noted. Oral:  Oral cavity and oropharynx are intact, symmetric, without erythema or edema.  Mucosa is moist without lesions. Neck: Full range of motion without pain.  There is no significant lymphadenopathy.  No masses palpable.  Thyroid  bed within normal limits to palpation.  Parotid glands and submandibular glands equal bilaterally without mass.  Trachea is midline. Neuro:  CN 2-12 grossly intact.   Her hearing test shows bilateral mild sensorineural hearing loss.  Assessment: 1.  The patient's history is consistent with bilateral eustachian tube dysfunction. 2.  Bilateral mild high-frequency sensorineural hearing loss, likely secondary to routine presbycusis. 3.  Chronic rhinitis with nasal mucosal congestion and bilateral inferior turbinate hypertrophy.  Plan: 1.  The physical exam findings and the hearing test results are reviewed with the patient. 2.  Flonase  nasal spray 2 sprays each nostril daily.  The importance of consistent daily use is discussed. 3.  Valsalva exercise multiple times a day. 4.  The patient will return for reevaluation in 6 weeks.  Tanya Lloyd 11/09/2023, 2:49 PM

## 2023-11-15 DIAGNOSIS — N3941 Urge incontinence: Secondary | ICD-10-CM | POA: Diagnosis not present

## 2023-11-15 DIAGNOSIS — R350 Frequency of micturition: Secondary | ICD-10-CM | POA: Diagnosis not present

## 2023-11-19 ENCOUNTER — Encounter: Payer: Self-pay | Admitting: Audiology

## 2023-12-19 ENCOUNTER — Encounter (INDEPENDENT_AMBULATORY_CARE_PROVIDER_SITE_OTHER): Payer: Self-pay | Admitting: Otolaryngology

## 2023-12-19 ENCOUNTER — Ambulatory Visit (INDEPENDENT_AMBULATORY_CARE_PROVIDER_SITE_OTHER): Admitting: Otolaryngology

## 2023-12-19 VITALS — BP 117/81 | HR 96

## 2023-12-19 DIAGNOSIS — J310 Chronic rhinitis: Secondary | ICD-10-CM

## 2023-12-19 DIAGNOSIS — R0981 Nasal congestion: Secondary | ICD-10-CM | POA: Diagnosis not present

## 2023-12-19 DIAGNOSIS — H6983 Other specified disorders of Eustachian tube, bilateral: Secondary | ICD-10-CM

## 2023-12-19 DIAGNOSIS — J343 Hypertrophy of nasal turbinates: Secondary | ICD-10-CM | POA: Diagnosis not present

## 2023-12-19 NOTE — Progress Notes (Signed)
 Patient ID: Tanya Lloyd, female   DOB: 14-Mar-1961, 63 y.o.   MRN: [id number]  Follow-up: Eustachian tube dysfunction, chronic nasal congestion  HPI: The patient is a 63 year old female who returns today for her follow-up evaluation.  The patient was last seen 6 weeks ago.  At that time, she was noted to have bilateral eustachian tube dysfunction, nasal mucosal congestion, and bilateral inferior turbinate hypertrophy.  She was treated with Flonase  nasal spray and Valsalva exercise.  The patient returns today reporting improvement in her nasal breathing.  She is able to perform the Valsalva exercise to auto insufflate her middle ear spaces.  She denies any otalgia, otorrhea, facial pain, or fever.  Exam: General: Communicates without difficulty, well nourished, no acute distress. Head: Normocephalic, no evidence injury, no tenderness, facial buttresses intact without stepoff. Face/sinus: No tenderness to palpation and percussion. Facial movement is normal and symmetric. Eyes: PERRL, EOMI. No scleral icterus, conjunctivae clear. Neuro: CN II exam reveals vision grossly intact.  No nystagmus at any point of gaze. Ears: Auricles well formed without lesions.  Ear canals are intact without mass or lesion.  No erythema or edema is appreciated.  The TMs are intact without fluid. Nose: External evaluation reveals normal support and skin without lesions.  Dorsum is intact.  Anterior rhinoscopy reveals congested mucosa over anterior aspect of inferior turbinates and intact septum.  No purulence noted. Oral:  Oral cavity and oropharynx are intact, symmetric, without erythema or edema.  Mucosa is moist without lesions. Neck: Full range of motion without pain.  There is no significant lymphadenopathy.  No masses palpable.  Thyroid  bed within normal limits to palpation.  Parotid glands and submandibular glands equal bilaterally without mass.  Trachea is midline. Neuro:  CN 2-12 grossly intact.   Assessment: 1.   Clinically improved bilateral eustachian tube dysfunction. 2.  Chronic rhinitis with nasal mucosal congestion and bilateral inferior turbinate hypertrophy.  The severity of her nasal congestion has decreased.  Plan: 1.  The physical exam findings are reviewed with the patient. 2.  Continue with Flonase  nasal spray and Valsalva exercise as needed. 3.  The patient will return for reevaluation in 6 months, sooner if needed.

## 2024-01-03 DIAGNOSIS — R519 Headache, unspecified: Secondary | ICD-10-CM | POA: Diagnosis not present

## 2024-01-03 DIAGNOSIS — R5383 Other fatigue: Secondary | ICD-10-CM | POA: Diagnosis not present

## 2024-01-03 DIAGNOSIS — E039 Hypothyroidism, unspecified: Secondary | ICD-10-CM | POA: Diagnosis not present

## 2024-01-03 DIAGNOSIS — F419 Anxiety disorder, unspecified: Secondary | ICD-10-CM | POA: Diagnosis not present

## 2024-01-03 DIAGNOSIS — E785 Hyperlipidemia, unspecified: Secondary | ICD-10-CM | POA: Diagnosis not present

## 2024-01-13 DIAGNOSIS — M545 Low back pain, unspecified: Secondary | ICD-10-CM | POA: Diagnosis not present

## 2024-01-15 DIAGNOSIS — R5383 Other fatigue: Secondary | ICD-10-CM | POA: Diagnosis not present

## 2024-01-15 DIAGNOSIS — E785 Hyperlipidemia, unspecified: Secondary | ICD-10-CM | POA: Diagnosis not present

## 2024-01-15 DIAGNOSIS — F419 Anxiety disorder, unspecified: Secondary | ICD-10-CM | POA: Diagnosis not present

## 2024-01-15 DIAGNOSIS — R519 Headache, unspecified: Secondary | ICD-10-CM | POA: Diagnosis not present

## 2024-02-02 DIAGNOSIS — R0982 Postnasal drip: Secondary | ICD-10-CM | POA: Diagnosis not present

## 2024-02-02 DIAGNOSIS — J0110 Acute frontal sinusitis, unspecified: Secondary | ICD-10-CM | POA: Diagnosis not present

## 2024-02-02 DIAGNOSIS — J029 Acute pharyngitis, unspecified: Secondary | ICD-10-CM | POA: Diagnosis not present

## 2024-02-27 DIAGNOSIS — S0512XA Contusion of eyeball and orbital tissues, left eye, initial encounter: Secondary | ICD-10-CM | POA: Diagnosis not present

## 2024-03-31 DIAGNOSIS — E039 Hypothyroidism, unspecified: Secondary | ICD-10-CM | POA: Diagnosis not present

## 2024-03-31 DIAGNOSIS — F419 Anxiety disorder, unspecified: Secondary | ICD-10-CM | POA: Diagnosis not present

## 2024-03-31 DIAGNOSIS — E669 Obesity, unspecified: Secondary | ICD-10-CM | POA: Diagnosis not present

## 2024-03-31 DIAGNOSIS — R519 Headache, unspecified: Secondary | ICD-10-CM | POA: Diagnosis not present

## 2024-03-31 DIAGNOSIS — R5383 Other fatigue: Secondary | ICD-10-CM | POA: Diagnosis not present

## 2024-03-31 DIAGNOSIS — E785 Hyperlipidemia, unspecified: Secondary | ICD-10-CM | POA: Diagnosis not present

## 2024-06-08 ENCOUNTER — Encounter (INDEPENDENT_AMBULATORY_CARE_PROVIDER_SITE_OTHER): Payer: Self-pay | Admitting: Otolaryngology

## 2024-06-08 ENCOUNTER — Ambulatory Visit (INDEPENDENT_AMBULATORY_CARE_PROVIDER_SITE_OTHER): Admitting: Otolaryngology

## 2024-06-08 VITALS — BP 112/73 | HR 70 | Temp 97.9°F

## 2024-06-08 DIAGNOSIS — J343 Hypertrophy of nasal turbinates: Secondary | ICD-10-CM

## 2024-06-08 DIAGNOSIS — J310 Chronic rhinitis: Secondary | ICD-10-CM

## 2024-06-08 DIAGNOSIS — H6983 Other specified disorders of Eustachian tube, bilateral: Secondary | ICD-10-CM

## 2024-06-09 NOTE — Progress Notes (Signed)
 Patient ID: Tanya Lloyd, female   DOB: Aug 27, 1960, 63 y.o.   MRN: [id number]  Follow up: Chronic nasal congestion, eustachian tube dysfunction  History of Present Illness Tanya Lloyd is a 63 year old female with bilateral eustachian tube dysfunction and chronic rhinitis with turbinate hypertrophy who presents for follow-up of persistent nasal congestion and ear pressure.  Over the past six months, she has experienced persistent nasal congestion and a sensation of being clogged. She currently feels congested, which she partially attributes to a recent upper respiratory illness. She reports chronic difficulty breathing through her nose and intermittent sensation of reduced nasal airway space.  She continues to experience bilateral ear pressure, though she is able to elicit pressure changes with Valsalva maneuvers. She denies hearing loss.  She has used intranasal fluticasone  intermittently for symptom management, previously using it daily but discontinuing after running out and during a recent illness. She has not maintained consistent therapy and is uncertain about the recommended duration of use.  Exam: General: Communicates without difficulty, well nourished, no acute distress. Head: Normocephalic, no evidence injury, no tenderness, facial buttresses intact without stepoff. Face/sinus: No tenderness to palpation and percussion. Facial movement is normal and symmetric. Eyes: PERRL, EOMI. No scleral icterus, conjunctivae clear. Neuro: CN II exam reveals vision grossly intact.  No nystagmus at any point of gaze. Ears: Auricles well formed without lesions.  Ear canals are intact without mass or lesion.  No erythema or edema is appreciated.  The TMs are intact without fluid. Nose: External evaluation reveals normal support and skin without lesions.  Dorsum is intact.  Anterior rhinoscopy reveals congested mucosa over anterior aspect of inferior turbinates and intact septum.  No purulence noted.  Oral:  Oral cavity and oropharynx are intact, symmetric, without erythema or edema.  Mucosa is moist without lesions. Neck: Full range of motion without pain.  There is no significant lymphadenopathy.  No masses palpable.  Thyroid  bed within normal limits to palpation.  Parotid glands and submandibular glands equal bilaterally without mass.  Trachea is midline. Neuro:  CN 2-12 grossly intact.    Assessment & Plan Bilateral eustachian tube dysfunction Chronic, persistent eustachian tube dysfunction with ongoing symptoms of aural fullness and pressure. No evidence of middle ear effusion or infection. Symptoms remain without acute changes. - Instructed her to perform Valsalva maneuver 20-30 times daily to facilitate eustachian tube patency. - Reinforced daily use of intranasal corticosteroid spray to reduce mucosal congestion and improve eustachian tube function. - Scheduled follow-up in two months to reassess symptoms and therapeutic response.  Chronic rhinitis with nasal mucosal congestion, nasal septal deviation, and bilateral inferior turbinate hypertrophy Persistent nasal congestion and impaired nasal airflow consistent with chronic rhinitis and inferior turbinate hypertrophy. Inconsistent intranasal corticosteroid use may contribute to ongoing symptoms. Examination revealed a deviated septum and persistent turbinate hypertrophy. No evidence of acute infection. - Advised resumption and maintenance of daily intranasal corticosteroid spray (Flonase  or Sensimist), two sprays per nostril once daily, with instruction to direct spray laterally toward the ipsilateral ear. - Provided anticipatory guidance that regular corticosteroid use will reduce mucosal edema but not alter underlying nasal anatomy. - Scheduled follow-up in two months to monitor symptom improvement and adherence.

## 2024-08-12 ENCOUNTER — Ambulatory Visit (INDEPENDENT_AMBULATORY_CARE_PROVIDER_SITE_OTHER): Admitting: Otolaryngology
